# Patient Record
Sex: Female | Born: 1985 | Race: White | Hispanic: No | Marital: Married | Smoking: Never smoker
Health system: Southern US, Community
[De-identification: ages and names within clinical notes are randomized; demographics above are authoritative.]

## PROBLEM LIST (undated history)

## (undated) DIAGNOSIS — O99345 Other mental disorders complicating the puerperium: Secondary | ICD-10-CM

## (undated) DIAGNOSIS — D8989 Other specified disorders involving the immune mechanism, not elsewhere classified: Secondary | ICD-10-CM

## (undated) DIAGNOSIS — O24419 Gestational diabetes mellitus in pregnancy, unspecified control: Secondary | ICD-10-CM

## (undated) DIAGNOSIS — L409 Psoriasis, unspecified: Secondary | ICD-10-CM

## (undated) DIAGNOSIS — O139 Gestational [pregnancy-induced] hypertension without significant proteinuria, unspecified trimester: Secondary | ICD-10-CM

## (undated) DIAGNOSIS — L4050 Arthropathic psoriasis, unspecified: Secondary | ICD-10-CM

## (undated) DIAGNOSIS — F53 Postpartum depression: Secondary | ICD-10-CM

## (undated) HISTORY — DX: Other specified disorders involving the immune mechanism, not elsewhere classified: D89.89

## (undated) HISTORY — PX: NO PAST SURGERIES: SHX2092

## (undated) HISTORY — DX: Other mental disorders complicating the puerperium: O99.345

## (undated) HISTORY — DX: Postpartum depression: F53.0

## (undated) HISTORY — DX: Gestational diabetes mellitus in pregnancy, unspecified control: O24.419

## (undated) HISTORY — DX: Psoriasis, unspecified: L40.9

## (undated) HISTORY — DX: Arthropathic psoriasis, unspecified: L40.50

## (undated) HISTORY — DX: Gestational (pregnancy-induced) hypertension without significant proteinuria, unspecified trimester: O13.9

---

## 2005-01-01 ENCOUNTER — Emergency Department (HOSPITAL_COMMUNITY): Admission: EM | Admit: 2005-01-01 | Discharge: 2005-01-01 | Payer: Self-pay | Admitting: Emergency Medicine

## 2009-09-02 ENCOUNTER — Emergency Department (HOSPITAL_COMMUNITY): Admission: EM | Admit: 2009-09-02 | Discharge: 2009-09-02 | Payer: Self-pay | Admitting: Emergency Medicine

## 2010-02-14 ENCOUNTER — Inpatient Hospital Stay (HOSPITAL_COMMUNITY)
Admission: EM | Admit: 2010-02-14 | Discharge: 2010-02-16 | Payer: Self-pay | Attending: Family Medicine | Admitting: Family Medicine

## 2010-02-15 ENCOUNTER — Encounter: Payer: Self-pay | Admitting: Family Medicine

## 2010-02-19 ENCOUNTER — Encounter: Payer: Self-pay | Admitting: Infectious Diseases

## 2010-02-19 ENCOUNTER — Ambulatory Visit
Admission: RE | Admit: 2010-02-19 | Discharge: 2010-02-19 | Payer: Self-pay | Source: Home / Self Care | Attending: Infectious Diseases | Admitting: Infectious Diseases

## 2010-02-19 DIAGNOSIS — R509 Fever, unspecified: Secondary | ICD-10-CM | POA: Insufficient documentation

## 2010-02-19 LAB — CONVERTED CEMR LAB
ANA Titer 1: 1:320 {titer} — ABNORMAL HIGH
Anti Nuclear Antibody(ANA): POSITIVE — AB
CMV IgM: 0.18 (ref <0.90)
Cytomegalovirus Ab-IgG: 0.25 (ref <0.90)
ENA SM Ab Ser-aCnc: <1 (ref <30)
HIV 1 RNA Quant: <20 copies/mL (ref <20)
HIV-1 RNA Quant, Log: <1.3 (ref <1.30)
Scleroderma (Scl-70) (ENA) Antibody, IgG: <1 (ref <30)
TSH: 3.663 microintl units/mL (ref 0.350–4.500)
Toxoplasma Antibody- IgM: <0.1
Toxoplasma IgG Ratio: <0.5
ds DNA Ab: 2 (ref <30)

## 2010-02-20 ENCOUNTER — Encounter: Payer: Self-pay | Admitting: Infectious Diseases

## 2010-02-23 ENCOUNTER — Telehealth: Payer: Self-pay | Admitting: Infectious Diseases

## 2010-03-01 ENCOUNTER — Ambulatory Visit
Admission: RE | Admit: 2010-03-01 | Discharge: 2010-03-01 | Payer: Self-pay | Source: Home / Self Care | Attending: Infectious Diseases | Admitting: Infectious Diseases

## 2010-03-01 ENCOUNTER — Encounter: Payer: Self-pay | Admitting: Infectious Diseases

## 2010-03-01 LAB — CONVERTED CEMR LAB
EBV NA IgG: 0.05
EBV VCA IgG: 0.09
EBV VCA IgM: 0.21

## 2010-03-08 ENCOUNTER — Encounter: Payer: Self-pay | Admitting: Infectious Diseases

## 2010-03-10 ENCOUNTER — Encounter: Payer: Self-pay | Admitting: Infectious Diseases

## 2010-03-18 NOTE — Miscellaneous (Signed)
Summary: HIPAA Restrictions  HIPAA Restrictions   Imported By: Florinda Marker 02/24/2010 15:05:24  _____________________________________________________________________  External Attachment:    Type:   Image     Comment:   External Document

## 2010-03-18 NOTE — Progress Notes (Signed)
Summary: Daily fevers continue  Phone Note Call from Patient Call back at Home Phone [phone]   Caller: Patient Reason for Call: Talk to Nurse Summary of Call: Pt. reporting daily fevers continuing, 100-103.  Has been taking ibuprofen without relief.  RN shared that ANA titre has decreased from hospital titre.  Pt. has return appt. for 03/01/10.  Pt. agreed that she would wait to discuss rheumatology referral at that visit. Jennet Maduro RN  February 23, 2010 4:17 PM

## 2010-03-18 NOTE — Assessment & Plan Note (Signed)
Summary: new pt fevers 103x10 days/per jh   CC:  new patient with fevers x 10 days.  History of Present Illness: 25 yo F without PMHx until developing fevers in December, as high as 104. She also had night sweats and chills. She developed arthritis during this period as well. She was seen in urgent care and was given a course of doxy (? RMSF, serologies negative). Her ANA was 1:1280, RA negative. She was hospitalized Feb 14, 2010 and was found to have a negative CT of the abd/pelvis (low density cervix?), TTE negative. CRP 14.8 and mild anemia. She had BCx Jan 1,2,3 all negative.   Since d/c from hospital has continued to have fever- up to 103.8 last night. Has had chills and rigors as well. Last week had CXR and ankle film, both negative.  ROS- Mild cough, dry. No sore throat or runny nose. Weight has been down 7-8 #, apetite is decreased but now improving. No lymphadenopathy. Has occas joint pain, transient, mostly in R knee and R ankle. Nl BM. Nl urination. Nl periods. No headache.   Preventive Screening-Counseling & Management  Alcohol-Tobacco     Alcohol drinks/day: socially     Smoking Status: never      Drug Use:  no.     Updated Prior Medication List: HYDROCODONE-ACETAMINOPHEN 5-500 MG TABS (HYDROCODONE-ACETAMINOPHEN) 1 tabket once daily ADVIL 200 MG TABS (IBUPROFEN) as needed joint pain HYDROCODONE-ACETAMINOPHEN 5-325 MG TABS (HYDROCODONE-ACETAMINOPHEN) as needed pain, fever JUNEL 1/20 1-20 MG-MCG TABS (NORETHINDRONE ACET-ETHINYL EST) as directed  Current Allergies (reviewed today): ! PCN Past History:  Past Medical History: Current Problems:  FEVER UNSPECIFIED (ICD-780.60)  Family History: mother with psoriasis, cousin with breast CA in her 30s.  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  Never Smoked Alcohol use-yes- occas.  Drug use-no Drug Use:  no  Review of Systems       has inside cat and dog. no recent travel. no  oral ulcers, no rashes. takes OCP for regularity.    Vital Signs:  Patient profile:   25 year old female Height:      66 inches (167.64 cm) Weight:      145.25 pounds (66.02 kg) BMI:     23.53 Temp:     101.3 degrees F (38.50 degrees C) oral Pulse rate:   134 / minute BP sitting:   129 / 84  (right arm)  Vitals Entered By: Starleen Arms CMA (February 19, 2010 10:45 AM)  Current Medications (verified): 1)  Hydrocodone-Acetaminophen 5-500 Mg Tabs (Hydrocodone-Acetaminophen) .Marland Kitchen.. 1 Tabket Once Daily 2)  Advil 200 Mg Tabs (Ibuprofen) .... As Needed Joint Pain 3)  Hydrocodone-Acetaminophen 5-325 Mg Tabs (Hydrocodone-Acetaminophen) .... As Needed Pain, Fever 4)  Junel 1/20 1-20 Mg-Mcg Tabs (Norethindrone Acet-Ethinyl Est) .... As Directed  Allergies (verified): 1)  ! Pcn  CC: new patient with fevers x 10 days Is Patient Diabetic? No Pain Assessment Patient in pain? no      Nutritional Status BMI of 19 -24 = normal Nutritional Status Detail gotten back recently  Does patient need assistance? Functional Status Self care Ambulation Normal   Physical Exam  General:  well-developed, well-nourished, and well-hydrated.   Eyes:  pupils equal, pupils round, and pupils reactive to light.   Mouth:  pharynx pink and moist and no exudates.   Neck:  no masses.   Lungs:  normal respiratory effort and normal breath sounds.   Heart:  normal rate, regular rhythm, and no murmur.  Abdomen:  soft, non-tender, normal bowel sounds, and no masses.   Extremities:  no edema Neurologic:  alert & oriented X3, cranial nerves II-XII intact, strength normal in all extremities, and sensation intact to light touch.   Skin:  no rashes and no edema.   Cervical Nodes:  no anterior cervical adenopathy.   Axillary Nodes:  no R axillary adenopathy and no L axillary adenopathy.     Impression & Recommendations:  Problem # 1:  FEVER UNSPECIFIED (ICD-780.60)  In her age group, most comonly fevers would  be due to infections or rheumatologic conditions. Will check her for infections that would be common- Mono like syndromes especially. If these are negative, would consider rheum eval of this patient, especailly given her very high ANA (although negative DS DNA, could consider further testing). return to clinic 1- 2weeks.   Orders: Consultation Level IV (16109) T-CMV IgM  Antibody ([address]) T-CMV IgG Antibody ([id number]) T-HIV Viral Load [phone]) T-ENA Panel ([id number]) T-Syphilis Test (RPR) (69629-52841) T-TSH (32440-10272) T-Toxoplasma Antibody IgG (53664-40347) T-Toxoplasma Antibody IgM (42595-63875) T-Culture, Blood Routine ([id number])  Medications Added to Medication List This Visit: 1)  Hydrocodone-acetaminophen 5-500 Mg Tabs (Hydrocodone-acetaminophen) .Marland Kitchen.. 1 tabket once daily 2)  Advil 200 Mg Tabs (Ibuprofen) .... As needed joint pain 3)  Hydrocodone-acetaminophen 5-325 Mg Tabs (Hydrocodone-acetaminophen) .... As needed pain, fever 4)  Junel 1/20 1-20 Mg-mcg Tabs (Norethindrone acet-ethinyl est) .... As directed

## 2010-03-18 NOTE — Assessment & Plan Note (Signed)
Summary: 1WK F/U/VS   CC:  joint aches and fatigue.  History of Present Illness: 25 yo F without PMHx until developing fevers in December, as high as 104. She also had night sweats and chills. She developed arthritis during this period as well. She was seen in urgent care and was given a course of doxy (? RMSF, serologies negative). Her ANA was 1:1280, RA negative. She was hospitalized Feb 14, 2010 and was found to have a negative CT of the abd/pelvis (low density cervix?), TTE negative. CRP 14.8 and mild anemia. She had BCx Jan 1,2,3 all negative.   Since d/c from hospital has continued to have fever- up to 103.8 last night. Has had chills and rigors as well. Last week had CXR and ankle film, both negative. Seen in ID clinic recently and had multiple blood tests done- allnegative except for ANA 1:320. Has not had fever for last 3 days. Had 101 4 days ago. Feels tired today and has pain in her calves. No rashes, no joint swelling. states "that my hair always falls out bad". no bald spots. 1 oral ulcer, since resolved. has 38 yo cat, 1dog. denies bites or scratches  Preventive Screening-Counseling & Management  Alcohol-Tobacco     Alcohol drinks/day: 0     Smoking Status: never   Updated Prior Medication List: ADVIL 200 MG TABS (IBUPROFEN) as needed joint pain JUNEL 1/20 1-20 MG-MCG TABS (NORETHINDRONE ACET-ETHINYL EST) as directed  Current Allergies: ! PCN Past History:  Past medical, surgical, family and social histories (including risk factors) reviewed, and no changes noted (except as noted below).  Past Medical History: Reviewed history from 02/19/2010 and no changes required. Current Problems:  FEVER UNSPECIFIED (ICD-780.60)  Family History: Reviewed history from 02/19/2010 and no changes required. mother with psoriasis, cousin with breast CA in her 30s.  Social History: Reviewed history from 02/19/2010 and no changes required. 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  Never Smoked Alcohol use-yes- occas.  Drug use-no  Additional History Menstrual Status:  regular  Vital Signs:  Patient profile:   25 year old female Menstrual status:  regular LMP:     01/25/2010 Height:      66 inches (167.64 cm) Weight:      145 pounds (65.91 kg) BMI:     23.49 Temp:     98.2 degrees F oral Pulse rate:   132 / minute BP sitting:   153 / 86  (right arm)  Vitals Entered By: Starleen Arms CMA (March 01, 2010 3:21 PM) CC: joint aches and fatigue Is Patient Diabetic? No Pain Assessment Patient in pain? no      Nutritional Status BMI of 19 -24 = normal  Does patient need assistance? Functional Status Self care Ambulation Normal LMP (date): 01/25/2010     Menstrual Status regular Enter LMP: 01/25/2010   Physical Exam  General:  well-developed, well-nourished, and well-hydrated.   Eyes:  pupils equal, pupils round, and pupils reactive to light.   Mouth:  pharynx pink and moist, no erythema, and no exudates.   Neck:  no masses.   Lungs:  normal respiratory effort and normal breath sounds.   Heart:  normal rate, regular rhythm, and no murmur.   Abdomen:  soft, non-tender, and normal bowel sounds.   Axillary Nodes:  no R axillary adenopathy and no L axillary adenopathy.     Impression & Recommendations:  Problem # 1:  FEVER UNSPECIFIED (ICD-780.60) Her infectious disease w/u has been negative. Will check  a EBV panel on her as well as cat scratch serologies.  Will have her eval by Rheumatology. will return to clinic as needed   Other Orders: Rheumatology Referral (Rheumatology) T-Epstein Barr Virus Antibody Panel I [phone]) T- * Misc. Laboratory test [phone]) Est. Patient Level III [phone])

## 2010-03-18 NOTE — Consult Note (Signed)
Summary: [facility]   Imported By: Florinda Marker 03/08/2010 13:33:17  _____________________________________________________________________  External Attachment:    Type:   Image     Comment:   External Document

## 2010-03-29 ENCOUNTER — Encounter: Payer: Self-pay | Admitting: Infectious Diseases

## 2010-04-13 NOTE — Consult Note (Signed)
[facility]   Imported By: Florinda Marker 04/09/2010 15:48:57  _____________________________________________________________________  External Attachment:    Type:   Image     Comment:   External Document

## 2010-04-22 NOTE — Consult Note (Signed)
Summary: [facility]   Imported By: Florinda Marker 04/13/2010 15:43:12  _____________________________________________________________________  External Attachment:    Type:   Image     Comment:   External Document

## 2010-04-26 LAB — CBC
HCT: 31.9 % — ABNORMAL LOW (ref 36.0–46.0)
HCT: 32.9 % — ABNORMAL LOW (ref 36.0–46.0)
Hemoglobin: 10.8 g/dL — ABNORMAL LOW (ref 12.0–15.0)
Hemoglobin: 11.1 g/dL — ABNORMAL LOW (ref 12.0–15.0)
MCH: 27.7 pg (ref 26.0–34.0)
MCH: 27.8 pg (ref 26.0–34.0)
MCHC: 33.7 g/dL (ref 30.0–36.0)
MCHC: 33.9 g/dL (ref 30.0–36.0)
MCV: 81.8 fL (ref 78.0–100.0)
MCV: 82.5 fL (ref 78.0–100.0)
Platelets: 277 10*3/uL (ref 150–400)
Platelets: 294 10*3/uL (ref 150–400)
RBC: 3.9 MIL/uL (ref 3.87–5.11)
RBC: 3.99 MIL/uL (ref 3.87–5.11)
RDW: 12.6 % (ref 11.5–15.5)
RDW: 13 % (ref 11.5–15.5)
WBC: 6.1 10*3/uL (ref 4.0–10.5)
WBC: 7.1 10*3/uL (ref 4.0–10.5)

## 2010-04-26 LAB — DIFFERENTIAL
Basophils Absolute: 0 10*3/uL (ref 0.0–0.1)
Basophils Relative: 0 % (ref 0–1)
Eosinophils Absolute: 0 10*3/uL (ref 0.0–0.7)
Eosinophils Relative: 0 % (ref 0–5)
Lymphocytes Relative: 30 % (ref 12–46)
Lymphs Abs: 2.1 10*3/uL (ref 0.7–4.0)
Monocytes Absolute: 0.4 10*3/uL (ref 0.1–1.0)
Monocytes Relative: 6 % (ref 3–12)
Neutro Abs: 4.5 10*3/uL (ref 1.7–7.7)
Neutrophils Relative %: 64 % (ref 43–77)

## 2010-04-26 LAB — CULTURE, BLOOD (ROUTINE X 2)
Culture  Setup Time: 201201020124
Culture  Setup Time: 201201020124
Culture  Setup Time: 201201032147
Culture  Setup Time: 201201032147
Culture: NO GROWTH
Culture: NO GROWTH
Culture: NO GROWTH
Culture: NO GROWTH

## 2010-04-26 LAB — C-REACTIVE PROTEIN: CRP: 14.8 mg/dL — ABNORMAL HIGH (ref <0.6)

## 2010-04-26 LAB — CULTURE, BLOOD (SINGLE)
Culture  Setup Time: 201201021122
Culture  Setup Time: 201201021648
Culture: NO GROWTH
Culture: NO GROWTH

## 2010-04-26 LAB — ANTI-DNA ANTIBODY, DOUBLE-STRANDED: ds DNA Ab: 2 IU/mL (ref <30)

## 2010-04-27 ENCOUNTER — Encounter: Payer: Self-pay | Admitting: *Deleted

## 2010-05-01 LAB — COMPREHENSIVE METABOLIC PANEL
ALT: 16 U/L (ref 0–35)
AST: 21 U/L (ref 0–37)
Albumin: 3.8 g/dL (ref 3.5–5.2)
Alkaline Phosphatase: 46 U/L (ref 39–117)
BUN: 4 mg/dL — ABNORMAL LOW (ref 6–23)
CO2: 23 mEq/L (ref 19–32)
Calcium: 9.1 mg/dL (ref 8.4–10.5)
Chloride: 103 mEq/L (ref 96–112)
Creatinine, Ser: 0.75 mg/dL (ref 0.4–1.2)
GFR calc Af Amer: >60 mL/min (ref >60)
GFR calc non Af Amer: >60 mL/min (ref >60)
Glucose, Bld: 104 mg/dL — ABNORMAL HIGH (ref 70–99)
Potassium: 3.5 mEq/L (ref 3.5–5.1)
Sodium: 135 mEq/L (ref 135–145)
Total Bilirubin: 0.4 mg/dL (ref 0.3–1.2)
Total Protein: 7.4 g/dL (ref 6.0–8.3)

## 2010-05-01 LAB — PROTIME-INR
INR: 1.11 (ref 0.00–1.49)
Prothrombin Time: 14.2 seconds (ref 11.6–15.2)

## 2010-05-01 LAB — CBC
HCT: 37 % (ref 36.0–46.0)
Hemoglobin: 12.8 g/dL (ref 12.0–15.0)
MCH: 30 pg (ref 26.0–34.0)
MCHC: 34.7 g/dL (ref 30.0–36.0)
MCV: 86.4 fL (ref 78.0–100.0)
Platelets: 248 10*3/uL (ref 150–400)
RBC: 4.27 MIL/uL (ref 3.87–5.11)
RDW: 12.5 % (ref 11.5–15.5)
WBC: 11.9 10*3/uL — ABNORMAL HIGH (ref 4.0–10.5)

## 2010-05-01 LAB — LACTIC ACID, PLASMA: Lactic Acid, Venous: 1.8 mmol/L (ref 0.5–2.2)

## 2010-05-01 LAB — SAMPLE TO BLOOD BANK

## 2011-01-23 ENCOUNTER — Ambulatory Visit (INDEPENDENT_AMBULATORY_CARE_PROVIDER_SITE_OTHER): Payer: 59

## 2011-01-23 DIAGNOSIS — R05 Cough: Secondary | ICD-10-CM

## 2011-01-23 DIAGNOSIS — R059 Cough, unspecified: Secondary | ICD-10-CM

## 2011-07-03 ENCOUNTER — Ambulatory Visit (INDEPENDENT_AMBULATORY_CARE_PROVIDER_SITE_OTHER): Payer: 59 | Admitting: Family Medicine

## 2011-07-03 VITALS — BP 131/78 | HR 82 | Temp 98.0°F | Resp 16 | Ht 64.0 in | Wt 156.0 lb

## 2011-07-03 DIAGNOSIS — IMO0001 Reserved for inherently not codable concepts without codable children: Secondary | ICD-10-CM

## 2011-07-03 DIAGNOSIS — Z309 Encounter for contraceptive management, unspecified: Secondary | ICD-10-CM

## 2011-07-03 MED ORDER — NORETHIN ACE-ETH ESTRAD-FE 1-20 MG-MCG PO TABS: 1.0000 | ORAL_TABLET | Freq: Every day | ORAL | Status: DC

## 2011-07-03 NOTE — Progress Notes (Signed)
  Patient Name: Anne Phelps Date of Birth: [birth year] Medical Record Number: [id number] Gender: female Date of Encounter: 07/03/2011  History of Present Illness:  Anne Phelps is a 26 y.o. very pleasant female patient who presents with the following:  Here to have a pap smear and RF of her OCP.  She is happy with her current OCP.  Episode of FUO last year- up to 103. Extensive w/u did not reveal a source and this finally went away.  She is currently feeling well and has no other concerns.  She does not desire any BW or screening for other STI - will do GC/ CMZ with her pap only  Patient Active Problem List  Diagnoses  . FEVER UNSPECIFIED   No past medical history on file. No past surgical history on file. History  Substance Use Topics  . Smoking status: Never Smoker   . Smokeless tobacco: Not on file  . Alcohol Use: Not on file   No family history on file. Allergies  Allergen Reactions  . Penicillins     Medication list has been reviewed and updated.  Review of Systems: As per HPI- otherwise negative.   Physical Examination: Filed Vitals:   07/03/11 0946  BP: 131/78  Pulse: 82  Temp: 98 F (36.7 C)  TempSrc: Oral  Resp: 16  Height: 5\' 4"  (1.626 m)  Weight: 156 lb (70.761 kg)    Body mass index is 26.78 kg/(m^2).  GEN: WDWN, NAD, Non-toxic, A & O x 3 HEENT: Atraumatic, Normocephalic. Neck supple. No masses, No LAD.  Tm, oropharynx wnl Ears and Nose: No external deformity. CV: RRR, No M/G/R. No JVD. No thrill. No extra heart sounds. PULM: CTA B, no wheezes, crackles, rhonchi. No retractions. No resp. distress. No accessory muscle use. ABD: S, NT, ND, +BS. No rebound. No HSM. EXTR: No c/c/e NEURO Normal gait.  PSYCH: Normally interactive. Conversant. Not depressed or anxious appearing.  Calm demeanor.  GU: normal external and internal exam.  No CMT, adnexa wnl   Assessment and Plan: 1. Contraception  norethindrone-ethinyl estradiol (JUNEL FE,GILDESS  FE,LOESTRIN FE) 1-20 MG-MCG tablet, Pap IG, CT/NG w/ reflex HPV when ASC-U   Await pap results and will follow- up with her.  Refilled her OCP as above, continue to use as directed, she is not a smoker

## 2011-07-06 ENCOUNTER — Other Ambulatory Visit: Payer: Self-pay | Admitting: Physician Assistant

## 2011-07-06 LAB — PAP IG, CT-NG, RFX HPV ASCU

## 2011-07-07 ENCOUNTER — Encounter: Payer: Self-pay | Admitting: Family Medicine

## 2012-03-13 ENCOUNTER — Ambulatory Visit (INDEPENDENT_AMBULATORY_CARE_PROVIDER_SITE_OTHER): Payer: 59 | Admitting: Physician Assistant

## 2012-03-13 VITALS — BP 146/89 | HR 96 | Temp 99.0°F | Resp 16 | Ht 65.0 in | Wt 159.8 lb

## 2012-03-13 DIAGNOSIS — J019 Acute sinusitis, unspecified: Secondary | ICD-10-CM

## 2012-03-13 DIAGNOSIS — R059 Cough, unspecified: Secondary | ICD-10-CM

## 2012-03-13 DIAGNOSIS — R05 Cough: Secondary | ICD-10-CM

## 2012-03-13 MED ORDER — IPRATROPIUM BROMIDE 0.06 % NA SOLN: 2.0000 | Freq: Three times a day (TID) | NASAL | Status: DC

## 2012-03-13 MED ORDER — AZITHROMYCIN 250 MG PO TABS: ORAL_TABLET | ORAL | Status: DC

## 2012-03-13 MED ORDER — HYDROCODONE-HOMATROPINE 5-1.5 MG/5ML PO SYRP
ORAL_SOLUTION | ORAL | Status: DC
Start: 2012-03-13 — End: 2012-05-26

## 2012-03-13 NOTE — Progress Notes (Signed)
Patient ID: Anne Phelps MRN: [id number], DOB: 04/28/1985, 27 y.o. Date of Encounter: 03/13/2012, 11:14 AM  Primary Physician: No primary provider on file.  Chief Complaint:  Chief Complaint  Patient presents with  . Sore Throat    Sunday  . Nasal Congestion  . Sinusitis  . Cough    non productive    HPI: 27 y.o. year old female presents with a two to three day history of nasal congestion, post nasal drip, sore throat, sinus pressure, and cough. Afebrile. No chills. No myalgias. Nasal congestion thick and green/yellow. Sinus pressure is the worst symptom. Cough is not productive and secondary to post nasal drip. It is not associated with time of day. Ears feel full, leading to sensation of muffled hearing. Has tried OTC cold preps without success. No GI complaints. Appetite normal. She works in a pre school with multiple sick contacts. No recent antibiotics or recent travels. No leg trauma, sedentary periods, h/o cancer, or tobacco use.  Past Medical History  Diagnosis Date  . Autoimmune disorder      Home Meds: Prior to Admission medications   Medication Sig Start Date End Date Taking? Authorizing Provider  ibuprofen (ADVIL,MOTRIN) 200 MG tablet as needed.     Yes Historical Provider, MD  norethindrone-ethinyl estradiol (JUNEL FE,GILDESS FE,LOESTRIN FE) 1-20 MG-MCG tablet Take 1 tablet by mouth daily. 07/03/11  Yes Pearline Cables, MD                         Allergies:  Allergies  Allergen Reactions  . Penicillins     History   Social History  . Marital Status: Single    Spouse Name: N/A    Number of Children: N/A  . Years of Education: N/A   Occupational History  . Not on file.   Social History Main Topics  . Smoking status: Never Smoker   . Smokeless tobacco: Not on file  . Alcohol Use: Not on file  . Drug Use: Not on file  . Sexually Active: Not on file   Other Topics Concern  . Not on file   Social History Narrative  . No narrative on file      Review of Systems: Constitutional: Positive for fatigue. Negative for chills, fever, night sweats or weight changes HEENT: see above Cardiovascular: negative for chest pain or palpitations Respiratory: Positive for cough and wheezing. Negative for hemoptysis, dyspnea, or shortness of breath Abdominal: negative for abdominal pain, nausea, vomiting or diarrhea Dermatological: negative for rash Neurologic: Positive for headache. Negative for dizziness or vertigo   Physical Exam: Blood pressure 146/89, pulse 96, temperature 99 F (37.2 C), temperature source Oral, resp. rate 16, height 5\' 5"  (1.651 m), weight 159 lb 12.8 oz (72.485 kg), SpO2 100.00%., Body mass index is 26.59 kg/(m^2). General: Well developed, well nourished, in no acute distress. Head: Normocephalic, atraumatic, eyes without discharge, sclera non-icteric, nares are congested. Bilateral auditory canals clear, TM's are without perforation, pearly grey with reflective cone of light bilaterally. Serous effusion bilaterally behind TM's. Bilaterally maxillary sinus TTP. Oral cavity moist, dentition normal. Posterior pharynx with post nasal drip and mild erythema. No peritonsillar abscess or tonsillar exudate. Uvula midline. Neck: Supple. No thyromegaly. Full ROM. No lymphadenopathy. Lungs: Clear bilaterally to auscultation without wheezes, rales, or rhonchi. Breathing is unlabored.  Heart: RRR with S1 S2. No murmurs, rubs, or gallops appreciated. Msk:  Strength and tone normal for age. Extremities: No clubbing or cyanosis. No  edema. Neuro: Alert and oriented X 3. Moves all extremities spontaneously. CNII-XII grossly in tact. Psych:  Responds to questions appropriately with a normal affect.     ASSESSMENT AND PLAN:  27 y.o. year old female with sinusitis -Azithromycin 500 mg 1 po daily #5 no RF -Atrovent NS 0.06% 2 sprays each nare bid prn #1 no RF -Hycodan #4oz 1 tsp po q 4-6 hours prn cough no RF  SED -Mucinex -Tylenol/Motrin prn -Rest/fluids -RTC precautions -RTC 3-5 days if no improvement  Signed, Eula Listen, PA-C 03/13/2012 11:14 AM

## 2012-05-26 ENCOUNTER — Ambulatory Visit (INDEPENDENT_AMBULATORY_CARE_PROVIDER_SITE_OTHER): Payer: 59 | Admitting: Physician Assistant

## 2012-05-26 VITALS — BP 130/80 | HR 72 | Temp 98.8°F | Resp 18 | Ht 64.0 in | Wt 161.4 lb

## 2012-05-26 DIAGNOSIS — IMO0001 Reserved for inherently not codable concepts without codable children: Secondary | ICD-10-CM

## 2012-05-26 DIAGNOSIS — Z309 Encounter for contraceptive management, unspecified: Secondary | ICD-10-CM

## 2012-05-26 DIAGNOSIS — N6459 Other signs and symptoms in breast: Secondary | ICD-10-CM

## 2012-05-26 MED ORDER — NORETHIN ACE-ETH ESTRAD-FE 1-20 MG-MCG PO TABS: 1.0000 | ORAL_TABLET | Freq: Every day | ORAL | Status: DC

## 2012-05-26 NOTE — Progress Notes (Signed)
  Subjective:    Patient ID: Anne Phelps, female    DOB: 11-05-1985, 27 y.o.   MRN: [id number]  HPI  27 yr old CF presents for [place] [place] planning.  She has been on [facility] pills for a while.  She and her fiancee want to stop using birth control around August of this year.  She isnt taking prenatal vitamins.  She has never been a smoker and her BP has always been normal.  She had a pap smear last year and it was normal.  No h/o hpv. Monogamous with fiancee.  Review of Systems  All other systems reviewed and are negative.       Objective:   Physical Exam  Nursing note and vitals reviewed. Constitutional: She is oriented to person, place, and time. She appears well-developed and well-nourished.  HENT:  Head: Normocephalic and atraumatic.  Cardiovascular: Normal rate, regular rhythm and normal heart sounds.   Pulmonary/Chest: Effort normal and breath sounds normal. Right breast exhibits no inverted nipple, no mass, no nipple discharge, no skin change and no tenderness. Left breast exhibits no inverted nipple, no mass, no nipple discharge, no skin change and no tenderness. Breasts are symmetrical.  Neurological: She is alert and oriented to person, place, and time.  Skin: Skin is warm and dry.  Psychiatric: She has a normal mood and affect. Her behavior is normal.      Assessment & Plan:  BC planning-start PNV now, well before going off OCP.  Once she stops OCPs, she needs to wait thru 3 regular periods before she stops using condoms. Pap smears every 3 years. None indicated today.

## 2012-05-31 ENCOUNTER — Other Ambulatory Visit: Payer: Self-pay | Admitting: Family Medicine

## 2013-02-14 NOTE — L&D Delivery Note (Signed)
Delivery Note At 8:57 AM a viable and healthy female was delivered via Vaginal, Spontaneous Delivery (Presentation: ROA ).  APGAR: 9, 9; weight  pending Placenta status: Intact, Spontaneous.  Cord: 3 vessels with the following complications: None.   Anesthesia: Epidural  Episiotomy: None Lacerations:1st degree Suture Repair: 3.0 monocryl Est. Blood Loss (mL): 200  Mom to postpartum.  Baby to Couplet care / Skin to Skin.  Lind Covert SNM 11/03/2013, 9:20 AM

## 2013-02-14 NOTE — L&D Delivery Note (Signed)
Agree with above assessment

## 2013-03-18 ENCOUNTER — Ambulatory Visit (INDEPENDENT_AMBULATORY_CARE_PROVIDER_SITE_OTHER): Payer: Medicaid Other | Admitting: *Deleted

## 2013-03-18 ENCOUNTER — Encounter: Payer: Self-pay | Admitting: *Deleted

## 2013-03-18 VITALS — BP 134/81 | Wt 167.0 lb

## 2013-03-18 DIAGNOSIS — Z3480 Encounter for supervision of other normal pregnancy, unspecified trimester: Secondary | ICD-10-CM

## 2013-03-18 NOTE — Patient Instructions (Signed)

## 2013-03-18 NOTE — Progress Notes (Signed)
P-85 New OB intake visit.  Patient is doing well.  Bloodwork and urine culture collected today and patient has made follow up appointment for one week.  Discussed first trimester screening and she will discuss with her fiance and let us know next week at her visit if this is something that they want to pursue.

## 2013-03-19 ENCOUNTER — Encounter: Payer: Self-pay | Admitting: Obstetrics and Gynecology

## 2013-03-19 DIAGNOSIS — Z6791 Unspecified blood type, Rh negative: Secondary | ICD-10-CM | POA: Insufficient documentation

## 2013-03-19 DIAGNOSIS — O26899 Other specified pregnancy related conditions, unspecified trimester: Secondary | ICD-10-CM

## 2013-03-19 LAB — OBSTETRIC PANEL
ANTIBODY SCREEN: NEGATIVE
BASOS ABS: 0 10*3/uL (ref 0.0–0.1)
BASOS PCT: 0 % (ref 0–1)
EOS ABS: 0.1 10*3/uL (ref 0.0–0.7)
Eosinophils Relative: 1 % (ref 0–5)
HCT: 34 % — ABNORMAL LOW (ref 36.0–46.0)
Hemoglobin: 11.7 g/dL — ABNORMAL LOW (ref 12.0–15.0)
Hepatitis B Surface Ag: NEGATIVE
Lymphocytes Relative: 18 % (ref 12–46)
Lymphs Abs: 1.7 10*3/uL (ref 0.7–4.0)
MCH: 28.4 pg (ref 26.0–34.0)
MCHC: 34.4 g/dL (ref 30.0–36.0)
MCV: 82.5 fL (ref 78.0–100.0)
MONO ABS: 0.5 10*3/uL (ref 0.1–1.0)
MONOS PCT: 5 % (ref 3–12)
NEUTROS ABS: 7.4 10*3/uL (ref 1.7–7.7)
NEUTROS PCT: 76 % (ref 43–77)
Platelets: 298 10*3/uL (ref 150–400)
RBC: 4.12 MIL/uL (ref 3.87–5.11)
RDW: 13.1 % (ref 11.5–15.5)
RH TYPE: NEGATIVE
Rubella: 19.3 Index — ABNORMAL HIGH (ref <0.90)
WBC: 9.7 10*3/uL (ref 4.0–10.5)

## 2013-03-19 LAB — HIV ANTIBODY (ROUTINE TESTING W REFLEX): HIV: NONREACTIVE

## 2013-03-19 LAB — CULTURE, OB URINE
COLONY COUNT: NO GROWTH
ORGANISM ID, BACTERIA: NO GROWTH

## 2013-03-20 LAB — CYSTIC FIBROSIS DIAGNOSTIC STUDY

## 2013-03-27 ENCOUNTER — Ambulatory Visit (INDEPENDENT_AMBULATORY_CARE_PROVIDER_SITE_OTHER): Payer: No Typology Code available for payment source | Admitting: Obstetrics and Gynecology

## 2013-03-27 ENCOUNTER — Encounter: Payer: Self-pay | Admitting: Obstetrics and Gynecology

## 2013-03-27 VITALS — BP 142/85 | Wt 168.4 lb

## 2013-03-27 DIAGNOSIS — O26899 Other specified pregnancy related conditions, unspecified trimester: Principal | ICD-10-CM

## 2013-03-27 DIAGNOSIS — D8989 Other specified disorders involving the immune mechanism, not elsewhere classified: Secondary | ICD-10-CM

## 2013-03-27 DIAGNOSIS — O360990 Maternal care for other rhesus isoimmunization, unspecified trimester, not applicable or unspecified: Secondary | ICD-10-CM

## 2013-03-27 DIAGNOSIS — L409 Psoriasis, unspecified: Secondary | ICD-10-CM | POA: Insufficient documentation

## 2013-03-27 DIAGNOSIS — Z113 Encounter for screening for infections with a predominantly sexual mode of transmission: Secondary | ICD-10-CM

## 2013-03-27 DIAGNOSIS — M359 Systemic involvement of connective tissue, unspecified: Secondary | ICD-10-CM

## 2013-03-27 DIAGNOSIS — Z3400 Encounter for supervision of normal first pregnancy, unspecified trimester: Secondary | ICD-10-CM | POA: Insufficient documentation

## 2013-03-27 DIAGNOSIS — Z6791 Unspecified blood type, Rh negative: Secondary | ICD-10-CM

## 2013-03-27 DIAGNOSIS — Z124 Encounter for screening for malignant neoplasm of cervix: Secondary | ICD-10-CM

## 2013-03-27 NOTE — Patient Instructions (Signed)
Pregnancy - First Trimester During sexual intercourse, millions of sperm go into the vagina. Only 1 sperm will penetrate and fertilize the female egg while it is in the Fallopian tube. One week later, the fertilized egg implants into the wall of the uterus. An embryo begins to develop into a baby. At 6 to 8 weeks, the eyes and face are formed and the heartbeat can be seen on ultrasound. At the end of 12 weeks (first trimester), all the baby's organs are formed. Now that you are pregnant, you will want to do everything you can to have a healthy baby. Two of the most important things are to get good prenatal care and follow your caregiver's instructions. Prenatal care is all the medical care you receive before the baby's birth. It is given to prevent, find, and treat problems during the pregnancy and childbirth. PRENATAL EXAMS  During prenatal visits, your weight, blood pressure, and urine are checked. This is done to make sure you are healthy and progressing normally during the pregnancy.  A pregnant woman should gain 25 to 35 pounds during the pregnancy. However, if you are overweight or underweight, your caregiver will advise you regarding your weight.  Your caregiver will ask and answer questions for you.  Blood work, cervical cultures, other necessary tests, and a Pap test are done during your prenatal exams. These tests are done to check on your health and the probable health of your baby. Tests are strongly recommended and done for HIV with your permission. This is the virus that causes AIDS. These tests are done because medicines can be given to help prevent your baby from being born with this infection should you have been infected without knowing it. Blood work is also used to find out your blood type, previous infections, and follow your blood levels (hemoglobin).  Low hemoglobin (anemia) is common during pregnancy. Iron and vitamins are given to help prevent this. Later in the pregnancy,  blood tests for diabetes will be done along with any other tests if any problems develop.  You may need other tests to make sure you and the baby are doing well. CHANGES DURING THE FIRST TRIMESTER  Your body goes through many changes during pregnancy. They vary from person to person. Talk to your caregiver about changes you notice and are concerned about. Changes can include:  Your menstrual period stops.  The egg and sperm carry the genes that determine what you look like. Genes from you and your partner are forming a baby. The female genes determine whether the baby is a boy or a girl.  Your body increases in girth and you may feel bloated.  Feeling sick to your stomach (nauseous) and throwing up (vomiting). If the vomiting is uncontrollable, call your caregiver.  Your breasts will begin to enlarge and become tender.  Your nipples may stick out more and become darker.  The need to urinate more. Painful urination may mean you have a bladder infection.  Tiring easily.  Loss of appetite.  Cravings for certain kinds of food.  At first, you may gain or lose a couple of pounds.  You may have changes in your emotions from day to day (excited to be pregnant or concerned something may go wrong with the pregnancy and baby).  You may have more vivid and strange dreams. HOME CARE INSTRUCTIONS   It is very important to avoid all smoking, alcohol and non-prescribed drugs during your pregnancy. These affect the formation and growth of the baby.   Avoid chemicals while pregnant to ensure the delivery of a healthy infant.  Start your prenatal visits by the 12th week of pregnancy. They are usually scheduled monthly at first, then more often in the last 2 months before delivery. Keep your caregiver's appointments. Follow your caregiver's instructions regarding medicine use, blood and lab tests, exercise, and diet.  During pregnancy, you are providing food for you and your baby. Eat regular,  well-balanced meals. Choose foods such as meat, fish, milk and other low fat dairy products, vegetables, fruits, and whole-grain breads and cereals. Your caregiver will tell you of the ideal weight gain.  You can help morning sickness by keeping soda crackers at the bedside. Eat a couple before arising in the morning. You may want to use the crackers without salt on them.  Eating 4 to 5 small meals rather than 3 large meals a day also may help the nausea and vomiting.  Drinking liquids between meals instead of during meals also seems to help nausea and vomiting.  A physical sexual relationship may be continued throughout pregnancy if there are no other problems. Problems may be early (premature) leaking of amniotic fluid from the membranes, vaginal bleeding, or belly (abdominal) pain.  Exercise regularly if there are no restrictions. Check with your caregiver or physical therapist if you are unsure of the safety of some of your exercises. Greater weight gain will occur in the last 2 trimesters of pregnancy. Exercising will help:  Control your weight.  Keep you in shape.  Prepare you for labor and delivery.  Help you lose your pregnancy weight after you deliver your baby.  Wear a good support or jogging bra for breast tenderness during pregnancy. This may help if worn during sleep too.  Ask when prenatal classes are available. Begin classes when they are offered.  Do not use hot tubs, steam rooms, or saunas.  Wear your seat belt when driving. This protects you and your baby if you are in an accident.  Avoid raw meat, uncooked cheese, cat litter boxes, and soil used by cats throughout the pregnancy. These carry germs that can cause birth defects in the baby.  The first trimester is a good time to visit your dentist for your dental health. Getting your teeth cleaned is okay. Use a softer toothbrush and brush gently during pregnancy.  Ask for help if you have financial, counseling, or  nutritional needs during pregnancy. Your caregiver will be able to offer counseling for these needs as well as refer you for other special needs.  Do not take any medicines or herbs unless told by your caregiver.  Inform your caregiver if there is any mental or physical domestic violence.  Make a list of emergency phone numbers of family, friends, hospital, and police and fire departments.  Write down your questions. Take them to your prenatal visit.  Do not douche.  Do not cross your legs.  If you have to stand for long periods of time, rotate you feet or take small steps in a circle.  You may have more vaginal secretions that may require a sanitary pad. Do not use tampons or scented sanitary pads. MEDICINES AND DRUG USE IN PREGNANCY  Take prenatal vitamins as directed. The vitamin should contain 1 milligram of folic acid. Keep all vitamins out of reach of children. Only a couple vitamins or tablets containing iron may be fatal to a baby or young child when ingested.  Avoid use of all medicines, including herbs, over-the-counter medicines, not   prescribed or suggested by your caregiver. Only take over-the-counter or prescription medicines for pain, discomfort, or fever as directed by your caregiver. Do not use aspirin, ibuprofen, or naproxen unless directed by your caregiver.  Let your caregiver also know about herbs you may be using.  Alcohol is related to a number of birth defects. This includes fetal alcohol syndrome. All alcohol, in any form, should be avoided completely. Smoking will cause low birth rate and premature babies.  Street or illegal drugs are very harmful to the baby. They are absolutely forbidden. A baby born to an addicted mother will be addicted at birth. The baby will go through the same withdrawal an adult does.  Let your caregiver know about any medicines that you have to take and for what reason you take them. SEEK MEDICAL CARE IF:  You have any concerns or  worries during your pregnancy. It is better to call with your questions if you feel they cannot wait, rather than worry about them. SEEK IMMEDIATE MEDICAL CARE IF:   An unexplained oral temperature above 102 F (38.9 C) develops, or as your caregiver suggests.  You have leaking of fluid from the vagina (birth canal). If leaking membranes are suspected, take your temperature and inform your caregiver of this when you call.  There is vaginal spotting or bleeding. Notify your caregiver of the amount and how many pads are used.  You develop a bad smelling vaginal discharge with a change in the color.  You continue to feel sick to your stomach (nauseated) and have no relief from remedies suggested. You vomit blood or coffee ground-like materials.  You lose more than 2 pounds of weight in 1 week.  You gain more than 2 pounds of weight in 1 week and you notice swelling of your face, hands, feet, or legs.  You gain 5 pounds or more in 1 week (even if you do not have swelling of your hands, face, legs, or feet).  You get exposed to [place] measles and have never had them.  You are exposed to fifth disease or chickenpox.  You develop belly (abdominal) pain. Round ligament discomfort is a common non-cancerous (benign) cause of abdominal pain in pregnancy. Your caregiver still must evaluate this.  You develop headache, fever, diarrhea, pain with urination, or shortness of breath.  You fall or are in a car accident or have any kind of trauma.  There is mental or physical violence in your home. Document Released: 01/25/2001 Document Revised: 10/26/2011 Document Reviewed: 07/29/2008 [facility] Patient Information 2014 [place].  Contraception Choices Contraception (birth control) is the use of any methods or devices to prevent pregnancy. Below are some methods to help avoid pregnancy. HORMONAL METHODS   Contraceptive implant This is a thin, plastic tube containing progesterone hormone. It  does not contain estrogen hormone. Your health care provider inserts the tube in the inner part of the upper arm. The tube can remain in place for up to 3 years. After 3 years, the implant must be removed. The implant prevents the ovaries from releasing an egg (ovulation), thickens the cervical mucus to prevent sperm from entering the uterus, and thins the lining of the inside of the uterus.  Progesterone-only injections These injections are given every 3 months by your health care provider to prevent pregnancy. This synthetic progesterone hormone stops the ovaries from releasing eggs. It also thickens cervical mucus and changes the uterine lining. This makes it harder for sperm to survive in the uterus.  Birth  control pills These pills contain estrogen and progesterone hormone. They work by preventing the ovaries from releasing eggs (ovulation). They also cause the cervical mucus to thicken, preventing the sperm from entering the uterus. Birth control pills are prescribed by a health care provider.Birth control pills can also be used to treat heavy periods.  Minipill This type of birth control pill contains only the progesterone hormone. They are taken every day of each month and must be prescribed by your health care provider.  Birth control patch The patch contains hormones similar to those in birth control pills. It must be changed once a week and is prescribed by a health care provider.  Vaginal ring The ring contains hormones similar to those in birth control pills. It is left in the vagina for 3 weeks, removed for 1 week, and then a new one is put back in place. The patient must be comfortable inserting and removing the ring from the vagina.A health care provider's prescription is necessary.  Emergency contraception Emergency contraceptives prevent pregnancy after unprotected sexual intercourse. This pill can be taken right after sex or up to 5 days after unprotected sex. It is most effective  the sooner you take the pills after having sexual intercourse. Most emergency contraceptive pills are available without a prescription. Check with your pharmacist. Do not use emergency contraception as your only form of birth control. BARRIER METHODS   Female condom This is a thin sheath (latex or rubber) that is worn over the penis during sexual intercourse. It can be used with spermicide to increase effectiveness.  Female condom. This is a soft, loose-fitting sheath that is put into the vagina before sexual intercourse.  Diaphragm This is a soft, latex, dome-shaped barrier that must be fitted by a health care provider. It is inserted into the vagina, along with a spermicidal jelly. It is inserted before intercourse. The diaphragm should be left in the vagina for 6 to 8 hours after intercourse.  Cervical cap This is a round, soft, latex or plastic cup that fits over the cervix and must be fitted by a health care provider. The cap can be left in place for up to 48 hours after intercourse.  Sponge This is a soft, circular piece of polyurethane foam. The sponge has spermicide in it. It is inserted into the vagina after wetting it and before sexual intercourse.  Spermicides These are chemicals that kill or block sperm from entering the cervix and uterus. They come in the form of creams, jellies, suppositories, foam, or tablets. They do not require a prescription. They are inserted into the vagina with an applicator before having sexual intercourse. The process must be repeated every time you have sexual intercourse. INTRAUTERINE CONTRACEPTION  Intrauterine device (IUD) This is a T-shaped device that is put in a woman's uterus during a menstrual period to prevent pregnancy. There are 2 types:  Copper IUD This type of IUD is wrapped in copper wire and is placed inside the uterus. Copper makes the uterus and fallopian tubes produce a fluid that kills sperm. It can stay in place for 10 years.  Hormone IUD  This type of IUD contains the hormone progestin (synthetic progesterone). The hormone thickens the cervical mucus and prevents sperm from entering the uterus, and it also thins the uterine lining to prevent implantation of a fertilized egg. The hormone can weaken or kill the sperm that get into the uterus. It can stay in place for 3 5 years, depending on which type of  IUD is used. PERMANENT METHODS OF CONTRACEPTION  Female tubal ligation This is when the woman's fallopian tubes are surgically sealed, tied, or blocked to prevent the egg from traveling to the uterus.  Hysteroscopic sterilization This involves placing a small coil or insert into each fallopian tube. Your doctor uses a technique called hysteroscopy to do the procedure. The device causes scar tissue to form. This results in permanent blockage of the fallopian tubes, so the sperm cannot fertilize the egg. It takes about 3 months after the procedure for the tubes to become blocked. You must use another form of birth control for these 3 months.  Female sterilization This is when the female has the tubes that carry sperm tied off (vasectomy).This blocks sperm from entering the vagina during sexual intercourse. After the procedure, the man can still ejaculate fluid (semen). NATURAL PLANNING METHODS  Natural family planning This is not having sexual intercourse or using a barrier method (condom, diaphragm, cervical cap) on days the woman could become pregnant.  Calendar method This is keeping track of the length of each menstrual cycle and identifying when you are fertile.  Ovulation method This is avoiding sexual intercourse during ovulation.  Symptothermal method This is avoiding sexual intercourse during ovulation, using a thermometer and ovulation symptoms.  Post ovulation method This is timing sexual intercourse after you have ovulated. Regardless of which type or method of contraception you choose, it is important that you use condoms to  protect against the transmission of sexually transmitted infections (STIs). Talk with your health care provider about which form of contraception is most appropriate for you. Document Released: 01/31/2005 Document Revised: 10/03/2012 Document Reviewed: 07/26/2012 [facility] Patient Information 2014 [place].  Breastfeeding Deciding to breastfeed is one of the best choices you can make for you and your baby. A change in hormones during pregnancy causes your breast tissue to grow and increases the number and size of your milk ducts. These hormones also allow proteins, sugars, and fats from your blood supply to make breast milk in your milk-producing glands. Hormones prevent breast milk from being released before your baby is born as well as prompt milk flow after birth. Once breastfeeding has begun, thoughts of your baby, as well as his or her sucking or crying, can stimulate the release of milk from your milk-producing glands.  BENEFITS OF BREASTFEEDING For Your Baby  Your first milk (colostrum) helps your baby's digestive system function better.   There are antibodies in your milk that help your baby fight off infections.   Your baby has a lower incidence of asthma, allergies, and sudden infant death syndrome.   The nutrients in breast milk are better for your baby than infant formulas and are designed uniquely for your baby's needs.   Breast milk improves your baby's brain development.   Your baby is less likely to develop other conditions, such as childhood obesity, asthma, or type 2 diabetes mellitus.  For You   Breastfeeding helps to create a very special bond between you and your baby.   Breastfeeding is convenient. Breast milk is always available at the correct temperature and costs nothing.   Breastfeeding helps to burn calories and helps you lose the weight gained during pregnancy.   Breastfeeding makes your uterus contract to its prepregnancy size faster and slows  bleeding (lochia) after you give birth.   Breastfeeding helps to lower your risk of developing type 2 diabetes mellitus, osteoporosis, and breast or ovarian cancer later in life. SIGNS THAT YOUR  BABY IS HUNGRY Early Signs of Hunger  Increased alertness or activity.  Stretching.  Movement of the head from side to side.  Movement of the head and opening of the mouth when the corner of the mouth or cheek is stroked (rooting).  Increased sucking sounds, smacking lips, cooing, sighing, or squeaking.  Hand-to-mouth movements.  Increased sucking of fingers or hands. Late Signs of Hunger  Fussing.  Intermittent crying. Extreme Signs of Hunger Signs of extreme hunger will require calming and consoling before your baby will be able to breastfeed successfully. Do not wait for the following signs of extreme hunger to occur before you initiate breastfeeding:   Restlessness.  A loud, strong cry.   Screaming. BREASTFEEDING BASICS Breastfeeding Initiation  Find a comfortable place to sit or lie down, with your neck and back well supported.  Place a pillow or rolled up blanket under your baby to bring him or her to the level of your breast (if you are seated). Nursing pillows are specially designed to help support your arms and your baby while you breastfeed.  Make sure that your baby's abdomen is facing your abdomen.   Gently massage your breast. With your fingertips, massage from your chest wall toward your nipple in a circular motion. This encourages milk flow. You may need to continue this action during the feeding if your milk flows slowly.  Support your breast with 4 fingers underneath and your thumb above your nipple. Make sure your fingers are well away from your nipple and your baby's mouth.   Stroke your baby's lips gently with your finger or nipple.   When your baby's mouth is open wide enough, quickly bring your baby to your breast, placing your entire nipple and as  much of the colored area around your nipple (areola) as possible into your baby's mouth.   More areola should be visible above your baby's upper lip than below the lower lip.   Your baby's tongue should be between his or her lower gum and your breast.   Ensure that your baby's mouth is correctly positioned around your nipple (latched). Your baby's lips should create a seal on your breast and be turned out (everted).  It is common for your baby to suck about 2 3 minutes in order to start the flow of breast milk. Latching Teaching your baby how to latch on to your breast properly is very important. An improper latch can cause nipple pain and decreased milk supply for you and poor weight gain in your baby. Also, if your baby is not latched onto your nipple properly, he or she may swallow some air during feeding. This can make your baby fussy. Burping your baby when you switch breasts during the feeding can help to get rid of the air. However, teaching your baby to latch on properly is still the best way to prevent fussiness from swallowing air while breastfeeding. Signs that your baby has successfully latched on to your nipple:    Silent tugging or silent sucking, without causing you pain.   Swallowing heard between every 3 4 sucks.    Muscle movement above and in front of his or her ears while sucking.  Signs that your baby has not successfully latched on to nipple:   Sucking sounds or smacking sounds from your baby while breastfeeding.  Nipple pain. If you think your baby has not latched on correctly, slip your finger into the corner of your baby's mouth to break the suction and place  it between your baby's gums. Attempt breastfeeding initiation again. Signs of Successful Breastfeeding Signs from your baby:   A gradual decrease in the number of sucks or complete cessation of sucking.   Falling asleep.   Relaxation of his or her body.   Retention of a small amount of milk in  his or her mouth.   Letting go of your breast by himself or herself. Signs from you:  Breasts that have increased in firmness, weight, and size 1 3 hours after feeding.   Breasts that are softer immediately after breastfeeding.  Increased milk volume, as well as a change in milk consistency and color by the 5th day of breastfeeding.   Nipples that are not sore, cracked, or bleeding. Signs That Your Baby is Getting Enough Milk  Wetting at least 3 diapers in a 24-hour period. The urine should be clear and pale yellow by age 5 days.  At least 3 stools in a 24-hour period by age 5 days. The stool should be soft and yellow.  At least 3 stools in a 24-hour period by age 7 days. The stool should be seedy and yellow.  No loss of weight greater than 10% of birth weight during the first 3 days of age.  Average weight gain of 4 7 ounces (120 210 mL) per week after age 4 days.  Consistent daily weight gain by age 5 days, without weight loss after the age of 2 weeks. After a feeding, your baby may spit up a small amount. This is common. BREASTFEEDING FREQUENCY AND DURATION Frequent feeding will help you make more milk and can prevent sore nipples and breast engorgement. Breastfeed when you feel the need to reduce the fullness of your breasts or when your baby shows signs of hunger. This is called "breastfeeding on demand." Avoid introducing a pacifier to your baby while you are working to establish breastfeeding (the first 4 6 weeks after your baby is born). After this time you may choose to use a pacifier. Research has shown that pacifier use during the first year of a baby's life decreases the risk of sudden infant death syndrome (SIDS). Allow your baby to feed on each breast as long as he or she wants. Breastfeed until your baby is finished feeding. When your baby unlatches or falls asleep while feeding from the first breast, offer the second breast. Because newborns are often sleepy in the  first few weeks of life, you may need to awaken your baby to get him or her to feed. Breastfeeding times will vary from baby to baby. However, the following rules can serve as a guide to help you ensure that your baby is properly fed:  Newborns (babies 4 weeks of age or younger) may breastfeed every 1 3 hours.  Newborns should not go longer than 3 hours during the day or 5 hours during the night without breastfeeding.  You should breastfeed your baby a minimum of 8 times in a 24-hour period until you begin to introduce solid foods to your baby at around 6 months of age. BREAST MILK PUMPING Pumping and storing breast milk allows you to ensure that your baby is exclusively fed your breast milk, even at times when you are unable to breastfeed. This is especially important if you are going back to work while you are still breastfeeding or when you are not able to be present during feedings. Your lactation consultant can give you guidelines on how long it is safe to store breast   milk.  A breast pump is a machine that allows you to pump milk from your breast into a sterile bottle. The pumped breast milk can then be stored in a refrigerator or freezer. Some breast pumps are operated by hand, while others use electricity. Ask your lactation consultant which type will work best for you. Breast pumps can be purchased, but some hospitals and breastfeeding support groups lease breast pumps on a monthly basis. A lactation consultant can teach you how to hand express breast milk, if you prefer not to use a pump.  CARING FOR YOUR BREASTS WHILE YOU BREASTFEED Nipples can become dry, cracked, and sore while breastfeeding. The following recommendations can help keep your breasts moisturized and healthy:  Avoid using soap on your nipples.   Wear a supportive bra. Although not required, special nursing bras and tank tops are designed to allow access to your breasts for breastfeeding without taking off your entire bra  or top. Avoid wearing underwire style bras or extremely tight bras.  Air dry your nipples for 3 84minutes after each feeding.   Use only cotton bra pads to absorb leaked breast milk. Leaking of breast milk between feedings is normal.   Use lanolin on your nipples after breastfeeding. Lanolin helps to maintain your skin's normal moisture barrier. If you use pure lanolin you do not need to wash it off before feeding your baby again. Pure lanolin is not toxic to your baby. You may also hand express a few drops of breast milk and gently massage that milk into your nipples and allow the milk to air dry. In the first few weeks after giving birth, some women experience extremely full breasts (engorgement). Engorgement can make your breasts feel heavy, warm, and tender to the touch. Engorgement peaks within 3 5 days after you give birth. The following recommendations can help ease engorgement:  Completely empty your breasts while breastfeeding or pumping. You may want to start by applying warm, moist heat (in the shower or with warm water-soaked hand towels) just before feeding or pumping. This increases circulation and helps the milk flow. If your baby does not completely empty your breasts while breastfeeding, pump any extra milk after he or she is finished.  Wear a snug bra (nursing or regular) or tank top for 1 2 days to signal your body to slightly decrease milk production.  Apply ice packs to your breasts, unless this is too uncomfortable for you.  Make sure that your baby is latched on and positioned properly while breastfeeding. If engorgement persists after 48 hours of following these recommendations, contact your health care provider or a Science writer. OVERALL HEALTH CARE RECOMMENDATIONS WHILE BREASTFEEDING  Eat healthy foods. Alternate between meals and snacks, eating 3 of each per day. Because what you eat affects your breast milk, some of the foods may make your baby more irritable  than usual. Avoid eating these foods if you are sure that they are negatively affecting your baby.  Drink milk, fruit juice, and water to satisfy your thirst (about 10 glasses a day).   Rest often, relax, and continue to take your prenatal vitamins to prevent fatigue, stress, and anemia.  Continue breast self-awareness checks.  Avoid chewing and smoking tobacco.  Avoid alcohol and drug use. Some medicines that may be harmful to your baby can pass through breast milk. It is important to ask your health care provider before taking any medicine, including all over-the-counter and prescription medicine as well as vitamin and herbal supplements.  It is possible to become pregnant while breastfeeding. If birth control is desired, ask your health care provider about options that will be safe for your baby. SEEK MEDICAL CARE IF:   You feel like you want to stop breastfeeding or have become frustrated with breastfeeding.  You have painful breasts or nipples.  Your nipples are cracked or bleeding.  Your breasts are red, tender, or warm.  You have a swollen area on either breast.  You have a fever or chills.  You have nausea or vomiting.  You have drainage other than breast milk from your nipples.  Your breasts do not become full before feedings by the 5th day after you give birth.  You feel sad and depressed.  Your baby is too sleepy to eat well.  Your baby is having trouble sleeping.   Your baby is wetting less than 3 diapers in a 24-hour period.  Your baby has less than 3 stools in a 24-hour period.  Your baby's skin or the white part of his or her eyes becomes yellow.   Your baby is not gaining weight by 6 days of age. SEEK IMMEDIATE MEDICAL CARE IF:   Your baby is overly tired (lethargic) and does not want to wake up and feed.  Your baby develops an unexplained fever. Document Released: 01/31/2005 Document Revised: 10/03/2012 Document Reviewed: 07/25/2012 [facility]  Patient Information 2014 [place].

## 2013-03-27 NOTE — Progress Notes (Signed)
   Subjective:    Anne Phelps is a G1P0 6551w2d by LMP seen today for her first obstetrical visit.  Her obstetrical history is significant for ? autoimmune disorder. She had a full workup with rheumatology 2 years ago. She is not currently taking any medications and her last flare up was 2 years ago. She was ruled out for Lupus and RA. Patient intends to breast and bottle feed. Pregnancy history fully reviewed.  Patient reports no complaints.  There were no vitals filed for this visit.  HISTORY: OB History  Gravida Para Term Preterm AB SAB TAB Ectopic Multiple Living  1             # Outcome Date GA Lbr Len/2nd Weight Sex Delivery Anes PTL Lv  1 CUR              Past Medical History  Diagnosis Date  . Autoimmune disorder    No past surgical history on file. No family history on file.   Exam    Uterus:     Pelvic Exam:    Perineum: Normal Perineum   Vulva: normal   Vagina:  normal mucosa, normal discharge   pH:    Cervix: closed, long   Adnexa: no mass, fullness, tenderness   Bony Pelvis: android  System: Breast:  normal appearance, no masses or tenderness   Skin: normal coloration and turgor, no rashes    Neurologic: normal, no focal deficits   Extremities: normal strength, tone, and muscle mass   HEENT extra ocular movement intact   Mouth/Teeth mucous membranes moist, pharynx normal without lesions   Neck supple and no masses   Cardiovascular: regular rate and rhythm   Respiratory:  chest clear, no wheezing, crepitations, rhonchi, normal symmetric air entry   Abdomen: soft, non-tender; bowel sounds normal; no masses,  no organomegaly   Urinary:       Assessment:    Pregnancy: G1P0 Patient Active Problem List   Diagnosis Date Noted  . Supervision of normal first pregnancy 03/27/2013  . 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 Rh negative state in antepartum period 03/19/2013  . FEVER UNSPECIFIED 02/19/2010        Plan:     Initial labs drawn. Prenatal  vitamins. Problem list reviewed and updated. Genetic Screening discussed First Screen: undecided. Patient will discuss with partner, may consider Quad screen  Ultrasound discussed; fetal survey: requested. Will be ordered at a later visit  Follow up in 4 weeks. Monitor BP. Patient denies history of HTN 50% of 30 min visit spent on counseling and coordination of care.     Anne Phelps 03/27/2013

## 2013-03-27 NOTE — Progress Notes (Signed)
P-90 

## 2013-04-23 ENCOUNTER — Other Ambulatory Visit: Payer: Self-pay | Admitting: Obstetrics & Gynecology

## 2013-04-23 DIAGNOSIS — Z3682 Encounter for antenatal screening for nuchal translucency: Secondary | ICD-10-CM

## 2013-04-24 ENCOUNTER — Ambulatory Visit (INDEPENDENT_AMBULATORY_CARE_PROVIDER_SITE_OTHER): Payer: Medicaid Other | Admitting: Obstetrics & Gynecology

## 2013-04-24 VITALS — BP 137/88 | Wt 165.0 lb

## 2013-04-24 DIAGNOSIS — M359 Systemic involvement of connective tissue, unspecified: Secondary | ICD-10-CM

## 2013-04-24 DIAGNOSIS — Z3400 Encounter for supervision of normal first pregnancy, unspecified trimester: Secondary | ICD-10-CM

## 2013-04-24 DIAGNOSIS — D8989 Other specified disorders involving the immune mechanism, not elsewhere classified: Secondary | ICD-10-CM

## 2013-04-24 DIAGNOSIS — Z6791 Unspecified blood type, Rh negative: Secondary | ICD-10-CM

## 2013-04-24 DIAGNOSIS — O360990 Maternal care for other rhesus isoimmunization, unspecified trimester, not applicable or unspecified: Secondary | ICD-10-CM

## 2013-04-24 DIAGNOSIS — O26899 Other specified pregnancy related conditions, unspecified trimester: Secondary | ICD-10-CM

## 2013-04-24 NOTE — Progress Notes (Signed)
P=79 

## 2013-04-24 NOTE — Patient Instructions (Signed)
Return to clinic for any obstetric concerns or go to [facility] for evaluation  

## 2013-04-24 NOTE — Progress Notes (Signed)
First screen will be done tomorrow, AFP screen next visit here in clinic.  No other complaints or concerns.  Routine obstetric precautions reviewed.

## 2013-04-25 ENCOUNTER — Other Ambulatory Visit: Payer: Self-pay

## 2013-04-25 ENCOUNTER — Ambulatory Visit (HOSPITAL_COMMUNITY)
Admission: RE | Admit: 2013-04-25 | Discharge: 2013-04-25 | Disposition: A | Discharge status: Home / Self Care | Payer: Medicaid Other | Source: Ambulatory Visit | Attending: Obstetrics & Gynecology | Admitting: Obstetrics & Gynecology

## 2013-04-25 DIAGNOSIS — O3510X0 Maternal care for (suspected) chromosomal abnormality in fetus, unspecified, not applicable or unspecified: Secondary | ICD-10-CM | POA: Insufficient documentation

## 2013-04-25 DIAGNOSIS — Z3682 Encounter for antenatal screening for nuchal translucency: Secondary | ICD-10-CM

## 2013-04-25 DIAGNOSIS — Z3689 Encounter for other specified antenatal screening: Secondary | ICD-10-CM | POA: Insufficient documentation

## 2013-04-25 DIAGNOSIS — O360990 Maternal care for other rhesus isoimmunization, unspecified trimester, not applicable or unspecified: Secondary | ICD-10-CM | POA: Insufficient documentation

## 2013-04-25 DIAGNOSIS — O351XX0 Maternal care for (suspected) chromosomal abnormality in fetus, not applicable or unspecified: Secondary | ICD-10-CM | POA: Insufficient documentation

## 2013-04-30 ENCOUNTER — Encounter: Payer: Self-pay | Admitting: Obstetrics & Gynecology

## 2013-05-23 ENCOUNTER — Ambulatory Visit (INDEPENDENT_AMBULATORY_CARE_PROVIDER_SITE_OTHER): Payer: Medicaid Other | Admitting: Obstetrics & Gynecology

## 2013-05-23 VITALS — BP 117/73 | Wt 168.0 lb

## 2013-05-23 DIAGNOSIS — Z3400 Encounter for supervision of normal first pregnancy, unspecified trimester: Secondary | ICD-10-CM

## 2013-05-23 NOTE — Progress Notes (Signed)
P = 66 

## 2013-05-23 NOTE — Patient Instructions (Signed)
Return to clinic for any obstetric concerns or go to [facility] for evaluation  

## 2013-05-23 NOTE — Progress Notes (Signed)
Getting married on May 3rd, so excited! Normal first screen, AFP draw today Anatomy scan ordered for 20 weeks No other complaints or concerns.  Routine obstetric precautions reviewed.

## 2013-05-27 ENCOUNTER — Encounter: Payer: Self-pay | Admitting: Obstetrics & Gynecology

## 2013-05-27 LAB — ALPHA FETOPROTEIN, MATERNAL
AFP: 23.2 IU/mL
Curr Gest Age: 16.2 wks.days
MOM FOR AFP: 0.79
Open Spina bifida: NEGATIVE

## 2013-06-03 ENCOUNTER — Encounter: Payer: Self-pay | Admitting: *Deleted

## 2013-06-14 ENCOUNTER — Ambulatory Visit (HOSPITAL_COMMUNITY)
Admission: RE | Admit: 2013-06-14 | Discharge: 2013-06-14 | Disposition: A | Discharge status: Home / Self Care | Payer: No Typology Code available for payment source | Source: Ambulatory Visit | Attending: Obstetrics & Gynecology | Admitting: Obstetrics & Gynecology

## 2013-06-14 DIAGNOSIS — Z3400 Encounter for supervision of normal first pregnancy, unspecified trimester: Secondary | ICD-10-CM

## 2013-06-14 DIAGNOSIS — Z3689 Encounter for other specified antenatal screening: Secondary | ICD-10-CM | POA: Diagnosis present

## 2013-06-18 ENCOUNTER — Encounter: Payer: Self-pay | Admitting: Obstetrics & Gynecology

## 2013-06-21 ENCOUNTER — Ambulatory Visit (INDEPENDENT_AMBULATORY_CARE_PROVIDER_SITE_OTHER): Payer: Medicaid Other | Admitting: Obstetrics & Gynecology

## 2013-06-21 VITALS — BP 132/73 | HR 84 | Wt 171.0 lb

## 2013-06-21 DIAGNOSIS — IMO0002 Reserved for concepts with insufficient information to code with codable children: Secondary | ICD-10-CM

## 2013-06-21 DIAGNOSIS — Z3400 Encounter for supervision of normal first pregnancy, unspecified trimester: Secondary | ICD-10-CM

## 2013-06-21 DIAGNOSIS — Z0489 Encounter for examination and observation for other specified reasons: Secondary | ICD-10-CM

## 2013-06-21 NOTE — Progress Notes (Signed)
Wedding went well, had some sunburn on legs after lounging on the beach. Mild edema bilaterally.  Not concerning, patient was told she can wear OTC compression stocking for relief and put legs up. Edema will worsen with the enlarging gravid uterus; DVT precautions reviewed.  Inadequate anatomy scan at 19 weeks, rescan ordered ~ 24 weeks No other complaints or concerns.  Routine obstetric precautions reviewed.

## 2013-06-21 NOTE — Patient Instructions (Signed)
Return to clinic for any obstetric concerns or go to [facility] for evaluation  

## 2013-07-18 ENCOUNTER — Telehealth: Payer: Self-pay

## 2013-07-18 NOTE — Telephone Encounter (Signed)
Pt wants to know when she had a tetanus shot.  She is pregnant and her doctor advised her to get one.  She knows she had one here, but thinks it was 2-3 years ago.  I looked in Epic and didn't see one.  Please call her at [phone]

## 2013-07-19 ENCOUNTER — Ambulatory Visit (INDEPENDENT_AMBULATORY_CARE_PROVIDER_SITE_OTHER): Payer: Medicaid Other | Admitting: Family Medicine

## 2013-07-19 VITALS — BP 141/76 | HR 71 | Wt 175.0 lb

## 2013-07-19 DIAGNOSIS — Z3400 Encounter for supervision of normal first pregnancy, unspecified trimester: Secondary | ICD-10-CM

## 2013-07-19 DIAGNOSIS — Z6791 Unspecified blood type, Rh negative: Secondary | ICD-10-CM

## 2013-07-19 DIAGNOSIS — O26899 Other specified pregnancy related conditions, unspecified trimester: Principal | ICD-10-CM

## 2013-07-19 DIAGNOSIS — O360990 Maternal care for other rhesus isoimmunization, unspecified trimester, not applicable or unspecified: Secondary | ICD-10-CM

## 2013-07-19 NOTE — Telephone Encounter (Signed)
Called patient and informed her that tetanus shot was given 06/18/2009.

## 2013-07-19 NOTE — Progress Notes (Signed)
BP mildly elevated today Recheck BP 28 wks labs next visit with Rhogam and TDaP [place] to be done on 6/8.

## 2013-07-19 NOTE — Patient Instructions (Signed)
Second Trimester of Pregnancy The second trimester is from week 13 through week 28, months 4 through 6. The second trimester is often a time when you feel your best. Your body has also adjusted to being pregnant, and you begin to feel better physically. Usually, morning sickness has lessened or quit completely, you may have more energy, and you may have an increase in appetite. The second trimester is also a time when the fetus is growing rapidly. At the end of the sixth month, the fetus is about 9 inches long and weighs about 1 pounds. You will likely begin to feel the baby move (quickening) between 18 and 20 weeks of the pregnancy. BODY CHANGES Your body goes through many changes during pregnancy. The changes vary from woman to woman.   Your weight will continue to increase. You will notice your lower abdomen bulging out.  You may begin to get stretch marks on your hips, abdomen, and breasts.  You may develop headaches that can be relieved by medicines approved by your caregiver.  You may urinate more often because the fetus is pressing on your bladder.  You may develop or continue to have heartburn as a result of your pregnancy.  You may develop constipation because certain hormones are causing the muscles that push waste through your intestines to slow down.  You may develop hemorrhoids or swollen, bulging veins (varicose veins).  You may have back pain because of the weight gain and pregnancy hormones relaxing your joints between the bones in your pelvis and as a result of a shift in weight and the muscles that support your balance.  Your breasts will continue to grow and be tender.  Your gums may bleed and may be sensitive to brushing and flossing.  Dark spots or blotches (chloasma, mask of pregnancy) may develop on your face. This will likely fade after the baby is born.  A dark line from your belly button to the pubic area (linea nigra) may appear. This will likely fade after  the baby is born. WHAT TO EXPECT AT YOUR PRENATAL VISITS During a routine prenatal visit:  You will be weighed to make sure you and the fetus are growing normally.  Your blood pressure will be taken.  Your abdomen will be measured to track your baby's growth.  The fetal heartbeat will be listened to.  Any test results from the previous visit will be discussed. Your caregiver may ask you:  How you are feeling.  If you are feeling the baby move.  If you have had any abnormal symptoms, such as leaking fluid, bleeding, severe headaches, or abdominal cramping.  If you have any questions. Other tests that may be performed during your second trimester include:  Blood tests that check for:  Low iron levels (anemia).  Gestational diabetes (between 24 and 28 weeks).  Rh antibodies.  Urine tests to check for infections, diabetes, or protein in the urine.  An ultrasound to confirm the proper growth and development of the baby.  An amniocentesis to check for possible genetic problems.  Fetal screens for spina bifida and Down syndrome. HOME CARE INSTRUCTIONS   Avoid all smoking, herbs, alcohol, and unprescribed drugs. These chemicals affect the formation and growth of the baby.  Follow your caregiver's instructions regarding medicine use. There are medicines that are either safe or unsafe to take during pregnancy.  Exercise only as directed by your caregiver. Experiencing uterine cramps is a good sign to stop exercising.  Continue to eat regular,   healthy meals.  Wear a good support bra for breast tenderness.  Do not use hot tubs, steam rooms, or saunas.  Wear your seat belt at all times when driving.  Avoid raw meat, uncooked cheese, cat litter boxes, and soil used by cats. These carry germs that can cause birth defects in the baby.  Take your prenatal vitamins.  Try taking a stool softener (if your caregiver approves) if you develop constipation. Eat more high-fiber  foods, such as fresh vegetables or fruit and whole grains. Drink plenty of fluids to keep your urine clear or pale yellow.  Take warm sitz baths to soothe any pain or discomfort caused by hemorrhoids. Use hemorrhoid cream if your caregiver approves.  If you develop varicose veins, wear support hose. Elevate your feet for 15 minutes, 3 4 times a day. Limit salt in your diet.  Avoid heavy lifting, wear low heel shoes, and practice good posture.  Rest with your legs elevated if you have leg cramps or low back pain.  Visit your dentist if you have not gone yet during your pregnancy. Use a soft toothbrush to brush your teeth and be gentle when you floss.  A sexual relationship may be continued unless your caregiver directs you otherwise.  Continue to go to all your prenatal visits as directed by your caregiver. SEEK MEDICAL CARE IF:   You have dizziness.  You have mild pelvic cramps, pelvic pressure, or nagging pain in the abdominal area.  You have persistent nausea, vomiting, or diarrhea.  You have a bad smelling vaginal discharge.  You have pain with urination. SEEK IMMEDIATE MEDICAL CARE IF:   You have a fever.  You are leaking fluid from your vagina.  You have spotting or bleeding from your vagina.  You have severe abdominal cramping or pain.  You have rapid weight gain or loss.  You have shortness of breath with chest pain.  You notice sudden or extreme swelling of your face, hands, ankles, feet, or legs.  You have not felt your baby move in over an hour.  You have severe headaches that do not go away with medicine.  You have vision changes. Document Released: 01/25/2001 Document Revised: 10/03/2012 Document Reviewed: 04/03/2012 [employer] Patient Information 2014 [employer].  Breastfeeding Deciding to breastfeed is one of the best choices you can make for you and your baby. A change in hormones during pregnancy causes your breast tissue to grow and increases the  number and size of your milk ducts. These hormones also allow proteins, sugars, and fats from your blood supply to make breast milk in your milk-producing glands. Hormones prevent breast milk from being released before your baby is born as well as prompt milk flow after birth. Once breastfeeding has begun, thoughts of your baby, as well as his or her sucking or crying, can stimulate the release of milk from your milk-producing glands.  BENEFITS OF BREASTFEEDING For Your Baby  Your first milk (colostrum) helps your baby's digestive system function better.   There are antibodies in your milk that help your baby fight off infections.   Your baby has a lower incidence of asthma, allergies, and sudden infant death syndrome.   The nutrients in breast milk are better for your baby than infant formulas and are designed uniquely for your baby's needs.   Breast milk improves your baby's brain development.   Your baby is less likely to develop other conditions, such as childhood obesity, asthma, or type 2 diabetes mellitus.  For   You   Breastfeeding helps to create a very special bond between you and your baby.   Breastfeeding is convenient. Breast milk is always available at the correct temperature and costs nothing.   Breastfeeding helps to burn calories and helps you lose the weight gained during pregnancy.   Breastfeeding makes your uterus contract to its prepregnancy size faster and slows bleeding (lochia) after you give birth.   Breastfeeding helps to lower your risk of developing type 2 diabetes mellitus, osteoporosis, and breast or ovarian cancer later in life. SIGNS THAT YOUR BABY IS HUNGRY Early Signs of Hunger  Increased alertness or activity.  Stretching.  Movement of the head from side to side.  Movement of the head and opening of the mouth when the corner of the mouth or cheek is stroked (rooting).  Increased sucking sounds, smacking lips, cooing, sighing, or  squeaking.  Hand-to-mouth movements.  Increased sucking of fingers or hands. Late Signs of Hunger  Fussing.  Intermittent crying. Extreme Signs of Hunger Signs of extreme hunger will require calming and consoling before your baby will be able to breastfeed successfully. Do not wait for the following signs of extreme hunger to occur before you initiate breastfeeding:   Restlessness.  A loud, strong cry.   Screaming. BREASTFEEDING BASICS Breastfeeding Initiation  Find a comfortable place to sit or lie down, with your neck and back well supported.  Place a pillow or rolled up blanket under your baby to bring him or her to the level of your breast (if you are seated). Nursing pillows are specially designed to help support your arms and your baby while you breastfeed.  Make sure that your baby's abdomen is facing your abdomen.   Gently massage your breast. With your fingertips, massage from your chest wall toward your nipple in a circular motion. This encourages milk flow. You may need to continue this action during the feeding if your milk flows slowly.  Support your breast with 4 fingers underneath and your thumb above your nipple. Make sure your fingers are well away from your nipple and your baby's mouth.   Stroke your baby's lips gently with your finger or nipple.   When your baby's mouth is open wide enough, quickly bring your baby to your breast, placing your entire nipple and as much of the colored area around your nipple (areola) as possible into your baby's mouth.   More areola should be visible above your baby's upper lip than below the lower lip.   Your baby's tongue should be between his or her lower gum and your breast.   Ensure that your baby's mouth is correctly positioned around your nipple (latched). Your baby's lips should create a seal on your breast and be turned out (everted).  It is common for your baby to suck about 2 3 minutes in order to start the  flow of breast milk. Latching Teaching your baby how to latch on to your breast properly is very important. An improper latch can cause nipple pain and decreased milk supply for you and poor weight gain in your baby. Also, if your baby is not latched onto your nipple properly, he or she may swallow some air during feeding. This can make your baby fussy. Burping your baby when you switch breasts during the feeding can help to get rid of the air. However, teaching your baby to latch on properly is still the best way to prevent fussiness from swallowing air while breastfeeding. Signs that your baby has   successfully latched on to your nipple:    Silent tugging or silent sucking, without causing you pain.   Swallowing heard between every 3 4 sucks.    Muscle movement above and in front of his or her ears while sucking.  Signs that your baby has not successfully latched on to nipple:   Sucking sounds or smacking sounds from your baby while breastfeeding.  Nipple pain. If you think your baby has not latched on correctly, slip your finger into the corner of your baby's mouth to break the suction and place it between your baby's gums. Attempt breastfeeding initiation again. Signs of Successful Breastfeeding Signs from your baby:   A gradual decrease in the number of sucks or complete cessation of sucking.   Falling asleep.   Relaxation of his or her body.   Retention of a small amount of milk in his or her mouth.   Letting go of your breast by himself or herself. Signs from you:  Breasts that have increased in firmness, weight, and size 1 3 hours after feeding.   Breasts that are softer immediately after breastfeeding.  Increased milk volume, as well as a change in milk consistency and color by the 5th day of breastfeeding.   Nipples that are not sore, cracked, or bleeding. Signs That Your Baby is Getting Enough Milk  Wetting at least 3 diapers in a 24-hour period. The urine  should be clear and pale yellow by age 5 days.  At least 3 stools in a 24-hour period by age 5 days. The stool should be soft and yellow.  At least 3 stools in a 24-hour period by age 7 days. The stool should be seedy and yellow.  No loss of weight greater than 10% of birth weight during the first 3 days of age.  Average weight gain of 4 7 ounces (120 210 mL) per week after age 4 days.  Consistent daily weight gain by age 5 days, without weight loss after the age of 2 weeks. After a feeding, your baby may spit up a small amount. This is common. BREASTFEEDING FREQUENCY AND DURATION Frequent feeding will help you make more milk and can prevent sore nipples and breast engorgement. Breastfeed when you feel the need to reduce the fullness of your breasts or when your baby shows signs of hunger. This is called "breastfeeding on demand." Avoid introducing a pacifier to your baby while you are working to establish breastfeeding (the first 4 6 weeks after your baby is born). After this time you may choose to use a pacifier. Research has shown that pacifier use during the first year of a baby's life decreases the risk of sudden infant death syndrome (SIDS). Allow your baby to feed on each breast as long as he or she wants. Breastfeed until your baby is finished feeding. When your baby unlatches or falls asleep while feeding from the first breast, offer the second breast. Because newborns are often sleepy in the first few weeks of life, you may need to awaken your baby to get him or her to feed. Breastfeeding times will vary from baby to baby. However, the following rules can serve as a guide to help you ensure that your baby is properly fed:  Newborns (babies 4 weeks of age or younger) may breastfeed every 1 3 hours.  Newborns should not go longer than 3 hours during the day or 5 hours during the night without breastfeeding.  You should breastfeed your baby a minimum of   8 times in a 24-hour period until  you begin to introduce solid foods to your baby at around 6 months of age. BREAST MILK PUMPING Pumping and storing breast milk allows you to ensure that your baby is exclusively fed your breast milk, even at times when you are unable to breastfeed. This is especially important if you are going back to work while you are still breastfeeding or when you are not able to be present during feedings. Your lactation consultant can give you guidelines on how long it is safe to store breast milk.  A breast pump is a machine that allows you to pump milk from your breast into a sterile bottle. The pumped breast milk can then be stored in a refrigerator or freezer. Some breast pumps are operated by hand, while others use electricity. Ask your lactation consultant which type will work best for you. Breast pumps can be purchased, but some hospitals and breastfeeding support groups lease breast pumps on a monthly basis. A lactation consultant can teach you how to hand express breast milk, if you prefer not to use a pump.  CARING FOR YOUR BREASTS WHILE YOU BREASTFEED Nipples can become dry, cracked, and sore while breastfeeding. The following recommendations can help keep your breasts moisturized and healthy:  Avoid using soap on your nipples.   Wear a supportive bra. Although not required, special nursing bras and tank tops are designed to allow access to your breasts for breastfeeding without taking off your entire bra or top. Avoid wearing underwire style bras or extremely tight bras.  Air dry your nipples for 3 4minutes after each feeding.   Use only cotton bra pads to absorb leaked breast milk. Leaking of breast milk between feedings is normal.   Use lanolin on your nipples after breastfeeding. Lanolin helps to maintain your skin's normal moisture barrier. If you use pure lanolin you do not need to wash it off before feeding your baby again. Pure lanolin is not toxic to your baby. You may also hand express a  few drops of breast milk and gently massage that milk into your nipples and allow the milk to air dry. In the first few weeks after giving birth, some women experience extremely full breasts (engorgement). Engorgement can make your breasts feel heavy, warm, and tender to the touch. Engorgement peaks within 3 5 days after you give birth. The following recommendations can help ease engorgement:  Completely empty your breasts while breastfeeding or pumping. You may want to start by applying warm, moist heat (in the shower or with warm water-soaked hand towels) just before feeding or pumping. This increases circulation and helps the milk flow. If your baby does not completely empty your breasts while breastfeeding, pump any extra milk after he or she is finished.  Wear a snug bra (nursing or regular) or tank top for 1 2 days to signal your body to slightly decrease milk production.  Apply ice packs to your breasts, unless this is too uncomfortable for you.  Make sure that your baby is latched on and positioned properly while breastfeeding. If engorgement persists after 48 hours of following these recommendations, contact your health care provider or a lactation consultant. OVERALL HEALTH CARE RECOMMENDATIONS WHILE BREASTFEEDING  Eat healthy foods. Alternate between meals and snacks, eating 3 of each per day. Because what you eat affects your breast milk, some of the foods may make your baby more irritable than usual. Avoid eating these foods if you are sure that they are   negatively affecting your baby.  Drink milk, fruit juice, and water to satisfy your thirst (about 10 glasses a day).   Rest often, relax, and continue to take your prenatal vitamins to prevent fatigue, stress, and anemia.  Continue breast self-awareness checks.  Avoid chewing and smoking tobacco.  Avoid alcohol and drug use. Some medicines that may be harmful to your baby can pass through breast milk. It is important to ask your  health care provider before taking any medicine, including all over-the-counter and prescription medicine as well as vitamin and herbal supplements. It is possible to become pregnant while breastfeeding. If birth control is desired, ask your health care provider about options that will be safe for your baby. SEEK MEDICAL CARE IF:   You feel like you want to stop breastfeeding or have become frustrated with breastfeeding.  You have painful breasts or nipples.  Your nipples are cracked or bleeding.  Your breasts are red, tender, or warm.  You have a swollen area on either breast.  You have a fever or chills.  You have nausea or vomiting.  You have drainage other than breast milk from your nipples.  Your breasts do not become full before feedings by the 5th day after you give birth.  You feel sad and depressed.  Your baby is too sleepy to eat well.  Your baby is having trouble sleeping.   Your baby is wetting less than 3 diapers in a 24-hour period.  Your baby has less than 3 stools in a 24-hour period.  Your baby's skin or the white part of his or her eyes becomes yellow.   Your baby is not gaining weight by 5 days of age. SEEK IMMEDIATE MEDICAL CARE IF:   Your baby is overly tired (lethargic) and does not want to wake up and feed.  Your baby develops an unexplained fever. Document Released: 01/31/2005 Document Revised: 10/03/2012 Document Reviewed: 07/25/2012 [employer] Patient Information 2014 [employer].  

## 2013-07-22 ENCOUNTER — Ambulatory Visit (HOSPITAL_COMMUNITY)
Admission: RE | Admit: 2013-07-22 | Discharge: 2013-07-22 | Disposition: A | Discharge status: Home / Self Care | Payer: No Typology Code available for payment source | Source: Ambulatory Visit | Attending: Obstetrics & Gynecology | Admitting: Obstetrics & Gynecology

## 2013-07-22 VITALS — BP 125/72 | HR 85 | Wt 174.2 lb

## 2013-07-22 DIAGNOSIS — Z1389 Encounter for screening for other disorder: Secondary | ICD-10-CM | POA: Insufficient documentation

## 2013-07-22 DIAGNOSIS — Z3689 Encounter for other specified antenatal screening: Secondary | ICD-10-CM | POA: Insufficient documentation

## 2013-07-22 DIAGNOSIS — IMO0002 Reserved for concepts with insufficient information to code with codable children: Secondary | ICD-10-CM

## 2013-07-22 DIAGNOSIS — Z6791 Unspecified blood type, Rh negative: Secondary | ICD-10-CM

## 2013-07-22 DIAGNOSIS — Z3400 Encounter for supervision of normal first pregnancy, unspecified trimester: Secondary | ICD-10-CM

## 2013-07-22 DIAGNOSIS — Z0489 Encounter for examination and observation for other specified reasons: Secondary | ICD-10-CM

## 2013-07-22 DIAGNOSIS — O26899 Other specified pregnancy related conditions, unspecified trimester: Secondary | ICD-10-CM

## 2013-07-22 DIAGNOSIS — Z363 Encounter for antenatal screening for malformations: Secondary | ICD-10-CM | POA: Insufficient documentation

## 2013-07-23 ENCOUNTER — Encounter: Payer: Self-pay | Admitting: Obstetrics & Gynecology

## 2013-08-14 ENCOUNTER — Ambulatory Visit (INDEPENDENT_AMBULATORY_CARE_PROVIDER_SITE_OTHER): Payer: Medicaid Other | Admitting: Obstetrics & Gynecology

## 2013-08-14 ENCOUNTER — Encounter: Payer: Self-pay | Admitting: Obstetrics & Gynecology

## 2013-08-14 VITALS — BP 117/76 | HR 74 | Wt 179.0 lb

## 2013-08-14 DIAGNOSIS — O360131 Maternal care for anti-D [Rh] antibodies, third trimester, fetus 1: Secondary | ICD-10-CM

## 2013-08-14 DIAGNOSIS — Z3403 Encounter for supervision of normal first pregnancy, third trimester: Secondary | ICD-10-CM

## 2013-08-14 DIAGNOSIS — Z23 Encounter for immunization: Secondary | ICD-10-CM

## 2013-08-14 DIAGNOSIS — O3090 Multiple gestation, unspecified, unspecified trimester: Secondary | ICD-10-CM

## 2013-08-14 DIAGNOSIS — O360121 Maternal care for anti-D [Rh] antibodies, second trimester, fetus 1: Secondary | ICD-10-CM

## 2013-08-14 DIAGNOSIS — O360990 Maternal care for other rhesus isoimmunization, unspecified trimester, not applicable or unspecified: Secondary | ICD-10-CM

## 2013-08-14 DIAGNOSIS — Z3400 Encounter for supervision of normal first pregnancy, unspecified trimester: Secondary | ICD-10-CM

## 2013-08-14 LAB — CBC WITH DIFFERENTIAL/PLATELET
BASOS PCT: 0 % (ref 0–1)
Basophils Absolute: 0 10*3/uL (ref 0.0–0.1)
Eosinophils Absolute: 0.1 10*3/uL (ref 0.0–0.7)
Eosinophils Relative: 1 % (ref 0–5)
HEMATOCRIT: 29.8 % — AB (ref 36.0–46.0)
HEMOGLOBIN: 10.1 g/dL — AB (ref 12.0–15.0)
LYMPHS ABS: 1.6 10*3/uL (ref 0.7–4.0)
LYMPHS PCT: 12 % (ref 12–46)
MCH: 29 pg (ref 26.0–34.0)
MCHC: 33.9 g/dL (ref 30.0–36.0)
MCV: 85.6 fL (ref 78.0–100.0)
MONO ABS: 0.4 10*3/uL (ref 0.1–1.0)
MONOS PCT: 3 % (ref 3–12)
NEUTROS ABS: 10.9 10*3/uL — AB (ref 1.7–7.7)
NEUTROS PCT: 84 % — AB (ref 43–77)
Platelets: 219 10*3/uL (ref 150–400)
RBC: 3.48 MIL/uL — AB (ref 3.87–5.11)
RDW: 13.3 % (ref 11.5–15.5)
WBC: 13 10*3/uL — AB (ref 4.0–10.5)

## 2013-08-14 MED ORDER — RHO D IMMUNE GLOBULIN 1500 UNIT/2ML IJ SOSY
300.0000 ug | PREFILLED_SYRINGE | Freq: Once | INTRAMUSCULAR | Status: AC
  Administered 2013-08-14: 300 ug via INTRAMUSCULAR

## 2013-08-14 NOTE — Progress Notes (Signed)
Rhogam and Tdap today 1 hour GTT today BP improved.  Continue to follow. F/u on 2 weeks

## 2013-08-14 NOTE — Addendum Note (Signed)
Addended by: Barbara CowerNOGUES, Avo Schlachter L on: 08/14/2013 11:43 AM   Modules accepted: Orders

## 2013-08-14 NOTE — Patient Instructions (Addendum)
Tetanus, Diphtheria, Pertussis (Tdap) Vaccine What You Need to Know WHY GET VACCINATED? Tetanus, diphtheria and pertussis can be very serious diseases, even for adolescents and adults. Tdap vaccine can protect [place] from these diseases. TETANUS (Lockjaw) causes painful muscle tightening and stiffness, usually all over the body.  It can lead to tightening of muscles in the head and neck so you can't open your mouth, swallow, or sometimes even breathe. Tetanus kills about 1 out of 5 people who are infected. DIPHTHERIA can cause a thick coating to form in the back of the throat.  It can lead to breathing problems, paralysis, heart failure, and death. PERTUSSIS (Whooping Cough) causes severe coughing spells, which can cause difficulty breathing, vomiting and disturbed sleep.  It can also lead to weight loss, incontinence, and rib fractures. Up to 2 in 100 adolescents and 5 in 100 adults with pertussis are hospitalized or have complications, which could include pneumonia and death. These diseases are caused by bacteria. Diphtheria and pertussis are spread from person to person through coughing or sneezing. Tetanus enters the body through cuts, scratches, or wounds. Before vaccines, the [place] saw as many as 200,000 cases a year of diphtheria and pertussis, and hundreds of cases of tetanus. Since vaccination began, tetanus and diphtheria have dropped by about 99% and pertussis by about 80%. TDAP VACCINE Tdap vaccine can protect adolescents and adults from tetanus, diphtheria, and pertussis. One dose of Tdap is routinely given at age 36 or 1. People who did not get Tdap at that age should get it as soon as possible. Tdap is especially important for health care professionals and anyone having close contact with a baby younger than 12 months. Pregnant women should get a dose of Tdap during every pregnancy, to protect the newborn from pertussis. Infants are most at risk for severe, life-threatening  complications from pertussis. A similar vaccine, called Td, protects from tetanus and diphtheria, but not pertussis. A Td booster should be given every 10 years. Tdap may be given as one of these boosters if you have not already gotten a dose. Tdap may also be given after a severe cut or burn to prevent tetanus infection. Your doctor can give you more information. Tdap may safely be given at the same time as other vaccines. SOME PEOPLE SHOULD NOT GET THIS VACCINE  If you ever had a life-threatening allergic reaction after a dose of any tetanus, diphtheria, or pertussis containing vaccine, OR if you have a severe allergy to any part of this vaccine, you should not get Tdap. Tell your doctor if you have any severe allergies.  If you had a coma, or long or multiple seizures within 7 days after a childhood dose of DTP or DTaP, you should not get Tdap, unless a cause other than the vaccine was found. You can still get Td.  Talk to your doctor if you:  have epilepsy or another nervous system problem,  had severe pain or swelling after any vaccine containing diphtheria, tetanus or pertussis,  ever had Guillain-Barr Syndrome (GBS),  aren't feeling well on the day the shot is scheduled. RISKS OF A VACCINE REACTION With any medicine, including vaccines, there is a chance of side effects. These are usually mild and go away on their own, but serious reactions are also possible. Brief fainting spells can follow a vaccination, leading to injuries from falling. Sitting or lying down for about 15 minutes can help prevent these. Tell your doctor if you feel dizzy or light-headed, or  have vision changes or ringing in the ears. Mild problems following Tdap (Did not interfere with activities)  Pain where the shot was given (about 3 in 4 adolescents or 2 in 3 adults)  Redness or swelling where the shot was given (about 1 person in 5)  Mild fever of at least 100.74F (up to about 1 in 25 adolescents or 1 in  100 adults)  Headache (about 3 or 4 people in 10)  Tiredness (about 1 person in 3 or 4)  Nausea, vomiting, diarrhea, stomach ache (up to 1 in 4 adolescents or 1 in 10 adults)  Chills, body aches, sore joints, rash, swollen glands (uncommon) Moderate problems following Tdap (Interfered with activities, but did not require medical attention)  Pain where the shot was given (about 1 in 5 adolescents or 1 in 100 adults)  Redness or swelling where the shot was given (up to about 1 in 16 adolescents or 1 in 25 adults)  Fever over 102F (about 1 in 100 adolescents or 1 in 250 adults)  Headache (about 3 in 20 adolescents or 1 in 10 adults)  Nausea, vomiting, diarrhea, stomach ache (up to 1 or 3 people in 100)  Swelling of the entire arm where the shot was given (up to about 3 in 100). Severe problems following Tdap (Unable to perform usual activities, required medical attention)  Swelling, severe pain, bleeding and redness in the arm where the shot was given (rare). A severe allergic reaction could occur after any vaccine (estimated less than 1 in a million doses). WHAT IF THERE IS A SERIOUS REACTION? What should I look for?  Look for anything that concerns you, such as signs of a severe allergic reaction, very high fever, or behavior changes. Signs of a severe allergic reaction can include hives, swelling of the face and throat, difficulty breathing, a fast heartbeat, dizziness, and weakness. These would start a few minutes to a few hours after the vaccination. What should I do?  If you think it is a severe allergic reaction or other emergency that can't wait, call 9-1-1 or get the person to the nearest hospital. Otherwise, call your doctor.  Afterward, the reaction should be reported to the "Vaccine Adverse Event Reporting System" (VAERS). Your doctor might file this report, or you can do it yourself through the VAERS web site at www.vaers.LA[place]ents.nohhs.gov, or by calling 1-(204)026-4484. VAERS is  only for reporting reactions. They do not give medical advice.  THE NATIONAL VACCINE INJURY COMPENSATION PROGRAM The National Vaccine Injury Compensation Program (VICP) is a federal program that was created to compensate people who may have been injured by certain vaccines. Persons who believe they may have been injured by a vaccine can learn about the program and about filing a claim by calling 1-(928)354-4264 or visiting the VICP website at SpiritualWord.atwww.hrsa.gov/vaccinecompensation. HOW CAN I LEARN MORE?  Ask your doctor.  Call your local or state health department.  Contact the Centers for Disease Control and Prevention (CDC):  Call 732-830-11141-414-698-7782 or visit CDC's website at PicCapture.uywww.cdc.gov/vaccines. CDC Tdap Vaccine VIS (06/23/11) Document Released: 08/02/2011 Document Revised: 05/28/2012 Document Reviewed: 05/23/2012 Exit[employer] Patient Information 2015 [employer]. This information is not intended to replace advice given to you by your health care provider. Make sure you discuss any questions you have with your health care provider.  Rh0 [D] Immune Globulin injection (Rhogam) What is this medicine? RhO [D] IMMUNE GLOBULIN (i MYOON GLOB yoo lin) is used to treat idiopathic thrombocytopenic purpura (ITP). This medicine is used  in RhO negative mothers who are pregnant with a RhO positive child. It is also used after a transfusion of RhO positive blood into a RhO negative person. This medicine may be used for other purposes; ask your health care provider or pharmacist if you have questions. COMMON BRAND NAME(S): BayRho-D, HyperRHO S/D, MICRhoGAM, RhoGAM, Rhophylac, WinRho SDF What should I tell my health care provider before I take this medicine? They need to know if you have any of these conditions: -bleeding disorders -low levels of immunoglobulin A in the body -no spleen -an unusual or allergic reaction to human immune globulin, other medicines, foods, dyes, or preservatives -pregnant or trying to  get pregnant -breast-feeding How should I use this medicine? This medicine is for injection into a muscle or into a vein. It is given by a health care professional in a hospital or clinic setting. Talk to your pediatrician regarding the use of this medicine in children. This medicine is not approved for use in children. Overdosage: If you think you have taken too much of this medicine contact a poison control center or emergency room at once. NOTE: This medicine is only for you. Do not share this medicine with others. What if I miss a dose? It is important not to miss your dose. Call your doctor or health care professional if you are unable to keep an appointment. What may interact with this medicine? -live virus vaccines, like measles, mumps, or rubella This list may not describe all possible interactions. Give your health care provider a list of all the medicines, herbs, non-prescription drugs, or dietary supplements you use. Also tell them if you smoke, drink alcohol, or use illegal drugs. Some items may interact with your medicine. What should I watch for while using this medicine? This medicine is made from human blood. It may be possible to pass an infection in this medicine. Talk to your doctor about the risks and benefits of this medicine. This medicine may interfere with live virus vaccines. Before you get live virus vaccines tell your health care professional if you have received this medicine within the past 3 months. What side effects may I notice from receiving this medicine? Side effects that you should report to your doctor or health care professional as soon as possible: -allergic reactions like skin rash, itching or hives, swelling of the face, lips, or tongue -breathing problems -chest pain or tightness -yellowing of the eyes or skin Side effects that usually do not require medical attention (report to your doctor or health care professional if they continue or are  bothersome): -fever -pain and tenderness at site where injected This list may not describe all possible side effects. Call your doctor for medical advice about side effects. You may report side effects to FDA at 1-800-FDA-1088. Where should I keep my medicine? This drug is given in a hospital or clinic and will not be stored at home. NOTE: This sheet is a summary. It may not cover all possible information. If you have questions about this medicine, talk to your doctor, pharmacist, or health care provider.  2015, [employer]. (2007-10-01 14:06:10)

## 2013-08-15 ENCOUNTER — Encounter: Payer: Medicaid Other | Admitting: Obstetrics & Gynecology

## 2013-08-15 ENCOUNTER — Encounter: Payer: Self-pay | Admitting: Obstetrics & Gynecology

## 2013-08-15 LAB — GLUCOSE TOLERANCE, 1 HOUR (50G) W/O FASTING: GLUCOSE 1 HOUR GTT: 160 mg/dL — AB (ref 70–140)

## 2013-08-15 LAB — HIV ANTIBODY (ROUTINE TESTING W REFLEX): HIV: NONREACTIVE

## 2013-08-15 LAB — RPR

## 2013-08-22 ENCOUNTER — Other Ambulatory Visit (INDEPENDENT_AMBULATORY_CARE_PROVIDER_SITE_OTHER): Payer: No Typology Code available for payment source | Admitting: *Deleted

## 2013-08-22 DIAGNOSIS — R7309 Other abnormal glucose: Secondary | ICD-10-CM

## 2013-08-22 DIAGNOSIS — O99810 Abnormal glucose complicating pregnancy: Secondary | ICD-10-CM

## 2013-08-22 NOTE — Progress Notes (Signed)
Pt is here today for a 3 hr GTT.

## 2013-08-23 ENCOUNTER — Encounter: Payer: Self-pay | Admitting: Obstetrics & Gynecology

## 2013-08-23 DIAGNOSIS — O99810 Abnormal glucose complicating pregnancy: Secondary | ICD-10-CM | POA: Insufficient documentation

## 2013-08-23 LAB — GLUCOSE TOLERANCE, 3 HOURS
GLUCOSE 3 HOUR GTT: 121 mg/dL (ref 70–144)
GLUCOSE, 1 HOUR-GESTATIONAL: 169 mg/dL (ref 70–189)
GLUCOSE, 2 HOUR-GESTATIONAL: 163 mg/dL (ref 70–164)
GLUCOSE, FASTING-GESTATIONAL: 77 mg/dL (ref 70–104)

## 2013-08-29 ENCOUNTER — Ambulatory Visit (INDEPENDENT_AMBULATORY_CARE_PROVIDER_SITE_OTHER): Payer: No Typology Code available for payment source | Admitting: Obstetrics & Gynecology

## 2013-08-29 VITALS — BP 137/77 | HR 70 | Wt 180.0 lb

## 2013-08-29 DIAGNOSIS — O99810 Abnormal glucose complicating pregnancy: Secondary | ICD-10-CM

## 2013-08-29 DIAGNOSIS — Z3400 Encounter for supervision of normal first pregnancy, unspecified trimester: Secondary | ICD-10-CM

## 2013-08-29 DIAGNOSIS — Z3403 Encounter for supervision of normal first pregnancy, third trimester: Secondary | ICD-10-CM

## 2013-08-29 DIAGNOSIS — K644 Residual hemorrhoidal skin tags: Secondary | ICD-10-CM

## 2013-08-29 NOTE — Patient Instructions (Signed)
Return to clinic for any obstetric concerns or go to [facility] for evaluation  

## 2013-08-29 NOTE — Progress Notes (Signed)
Noted some hemorrhoids, not symptomatic. No treatment needed for now. Normal 3 hr GTT; emphasized avoidance of refined carbohydrates and balanced diet as she is still at risk for macrosomia No other complaints or concerns.  Fetal movement and labor precautions reviewed.

## 2013-09-12 ENCOUNTER — Ambulatory Visit (INDEPENDENT_AMBULATORY_CARE_PROVIDER_SITE_OTHER): Payer: No Typology Code available for payment source | Admitting: Family Medicine

## 2013-09-12 VITALS — BP 130/74 | HR 81 | Wt 181.0 lb

## 2013-09-12 DIAGNOSIS — Z3400 Encounter for supervision of normal first pregnancy, unspecified trimester: Secondary | ICD-10-CM

## 2013-09-12 DIAGNOSIS — Z3403 Encounter for supervision of normal first pregnancy, third trimester: Secondary | ICD-10-CM

## 2013-09-12 NOTE — Patient Instructions (Signed)
Third Trimester of Pregnancy The third trimester is from week 29 through week 42, months 7 through 9. The third trimester is a time when the fetus is growing rapidly. At the end of the ninth month, the fetus is about 20 inches in length and weighs 6-10 pounds.  BODY CHANGES Your body goes through many changes during pregnancy. The changes vary from woman to woman.   Your weight will continue to increase. You can expect to gain 25-35 pounds (11-16 kg) by the end of the pregnancy.  You may begin to get stretch marks on your hips, abdomen, and breasts.  You may urinate more often because the fetus is moving lower into your pelvis and pressing on your bladder.  You may develop or continue to have heartburn as a result of your pregnancy.  You may develop constipation because certain hormones are causing the muscles that push waste through your intestines to slow down.  You may develop hemorrhoids or swollen, bulging veins (varicose veins).  You may have pelvic pain because of the weight gain and pregnancy hormones relaxing your joints between the bones in your pelvis. Backaches may result from overexertion of the muscles supporting your posture.  You may have changes in your hair. These can include thickening of your hair, rapid growth, and changes in texture. Some women also have hair loss during or after pregnancy, or hair that feels dry or thin. Your hair will most likely return to normal after your baby is born.  Your breasts will continue to grow and be tender. A yellow discharge may leak from your breasts called colostrum.  Your belly button may stick out.  You may feel short of breath because of your expanding uterus.  You may notice the fetus "dropping," or moving lower in your abdomen.  You may have a bloody mucus discharge. This usually occurs a few days to a week before labor begins.  Your cervix becomes thin and soft (effaced) near your due date. WHAT TO EXPECT AT YOUR  PRENATAL EXAMS  You will have prenatal exams every 2 weeks until week 36. Then, you will have weekly prenatal exams. During a routine prenatal visit:  You will be weighed to make sure you and the fetus are growing normally.  Your blood pressure is taken.  Your abdomen will be measured to track your baby's growth.  The fetal heartbeat will be listened to.  Any test results from the previous visit will be discussed.  You may have a cervical check near your due date to see if you have effaced. At around 36 weeks, your caregiver will check your cervix. At the same time, your caregiver will also perform a test on the secretions of the vaginal tissue. This test is to determine if a type of bacteria, Group B streptococcus, is present. Your caregiver will explain this further. Your caregiver may ask you:  What your birth plan is.  How you are feeling.  If you are feeling the baby move.  If you have had any abnormal symptoms, such as leaking fluid, bleeding, severe headaches, or abdominal cramping.  If you have any questions. Other tests or screenings that may be performed during your third trimester include:  Blood tests that check for low iron levels (anemia).  Fetal testing to check the health, activity level, and growth of the fetus. Testing is done if you have certain medical conditions or if there are problems during the pregnancy. FALSE LABOR You may feel small, irregular contractions that   eventually go away. These are called Braxton Hicks contractions, or false labor. Contractions may last for hours, days, or even weeks before true labor sets in. If contractions come at regular intervals, intensify, or become painful, it is best to be seen by your caregiver.  SIGNS OF LABOR   Menstrual-like cramps.  Contractions that are 5 minutes apart or less.  Contractions that start on the top of the uterus and spread down to the lower abdomen and back.  A sense of increased pelvic  pressure or back pain.  A watery or bloody mucus discharge that comes from the vagina. If you have any of these signs before the 37th week of pregnancy, call your caregiver right away. You need to go to the hospital to get checked immediately. HOME CARE INSTRUCTIONS   Avoid all smoking, herbs, alcohol, and unprescribed drugs. These chemicals affect the formation and growth of the baby.  Follow your caregiver's instructions regarding medicine use. There are medicines that are either safe or unsafe to take during pregnancy.  Exercise only as directed by your caregiver. Experiencing uterine cramps is a good sign to stop exercising.  Continue to eat regular, healthy meals.  Wear a good support bra for breast tenderness.  Do not use hot tubs, steam rooms, or saunas.  Wear your seat belt at all times when driving.  Avoid raw meat, uncooked cheese, cat litter boxes, and soil used by cats. These carry germs that can cause birth defects in the baby.  Take your prenatal vitamins.  Try taking a stool softener (if your caregiver approves) if you develop constipation. Eat more high-fiber foods, such as fresh vegetables or fruit and whole grains. Drink plenty of fluids to keep your urine clear or pale yellow.  Take warm sitz baths to soothe any pain or discomfort caused by hemorrhoids. Use hemorrhoid cream if your caregiver approves.  If you develop varicose veins, wear support hose. Elevate your feet for 15 minutes, 3-4 times a day. Limit salt in your diet.  Avoid heavy lifting, wear low heal shoes, and practice good posture.  Rest a lot with your legs elevated if you have leg cramps or low back pain.  Visit your dentist if you have not gone during your pregnancy. Use a soft toothbrush to brush your teeth and be gentle when you floss.  A sexual relationship may be continued unless your caregiver directs you otherwise.  Do not travel far distances unless it is absolutely necessary and only  with the approval of your caregiver.  Take prenatal classes to understand, practice, and ask questions about the labor and delivery.  Make a trial run to the hospital.  Pack your hospital bag.  Prepare the baby's nursery.  Continue to go to all your prenatal visits as directed by your caregiver. SEEK MEDICAL CARE IF:  You are unsure if you are in labor or if your water has broken.  You have dizziness.  You have mild pelvic cramps, pelvic pressure, or nagging pain in your abdominal area.  You have persistent nausea, vomiting, or diarrhea.  You have a bad smelling vaginal discharge.  You have pain with urination. SEEK IMMEDIATE MEDICAL CARE IF:   You have a fever.  You are leaking fluid from your vagina.  You have spotting or bleeding from your vagina.  You have severe abdominal cramping or pain.  You have rapid weight loss or gain.  You have shortness of breath with chest pain.  You notice sudden or extreme swelling   of your face, hands, ankles, feet, or legs.  You have not felt your baby move in over an hour.  You have severe headaches that do not go away with medicine.  You have vision changes. Document Released: 01/25/2001 Document Revised: 02/05/2013 Document Reviewed: 04/03/2012 [employer] Patient Information 2015 [employer]. This information is not intended to replace advice given to you by your health care provider. Make sure you discuss any questions you have with your health care provider.  Breastfeeding Deciding to breastfeed is one of the best choices you can make for you and your baby. A change in hormones during pregnancy causes your breast tissue to grow and increases the number and size of your milk ducts. These hormones also allow proteins, sugars, and fats from your blood supply to make breast milk in your milk-producing glands. Hormones prevent breast milk from being released before your baby is born as well as prompt milk flow after birth. Once  breastfeeding has begun, thoughts of your baby, as well as his or her sucking or crying, can stimulate the release of milk from your milk-producing glands.  BENEFITS OF BREASTFEEDING For Your Baby  Your first milk (colostrum) helps your baby's digestive system function better.   There are antibodies in your milk that help your baby fight off infections.   Your baby has a lower incidence of asthma, allergies, and sudden infant death syndrome.   The nutrients in breast milk are better for your baby than infant formulas and are designed uniquely for your baby's needs.   Breast milk improves your baby's brain development.   Your baby is less likely to develop other conditions, such as childhood obesity, asthma, or type 2 diabetes mellitus.  For You   Breastfeeding helps to create a very special bond between you and your baby.   Breastfeeding is convenient. Breast milk is always available at the correct temperature and costs nothing.   Breastfeeding helps to burn calories and helps you lose the weight gained during pregnancy.   Breastfeeding makes your uterus contract to its prepregnancy size faster and slows bleeding (lochia) after you give birth.   Breastfeeding helps to lower your risk of developing type 2 diabetes mellitus, osteoporosis, and breast or ovarian cancer later in life. SIGNS THAT YOUR BABY IS HUNGRY Early Signs of Hunger  Increased alertness or activity.  Stretching.  Movement of the head from side to side.  Movement of the head and opening of the mouth when the corner of the mouth or cheek is stroked (rooting).  Increased sucking sounds, smacking lips, cooing, sighing, or squeaking.  Hand-to-mouth movements.  Increased sucking of fingers or hands. Late Signs of Hunger  Fussing.  Intermittent crying. Extreme Signs of Hunger Signs of extreme hunger will require calming and consoling before your baby will be able to breastfeed successfully. Do not  wait for the following signs of extreme hunger to occur before you initiate breastfeeding:   Restlessness.  A loud, strong cry.   Screaming. BREASTFEEDING BASICS Breastfeeding Initiation  Find a comfortable place to sit or lie down, with your neck and back well supported.  Place a pillow or rolled up blanket under your baby to bring him or her to the level of your breast (if you are seated). Nursing pillows are specially designed to help support your arms and your baby while you breastfeed.  Make sure that your baby's abdomen is facing your abdomen.   Gently massage your breast. With your fingertips, massage from your chest   wall toward your nipple in a circular motion. This encourages milk flow. You may need to continue this action during the feeding if your milk flows slowly.  Support your breast with 4 fingers underneath and your thumb above your nipple. Make sure your fingers are well away from your nipple and your baby's mouth.   Stroke your baby's lips gently with your finger or nipple.   When your baby's mouth is open wide enough, quickly bring your baby to your breast, placing your entire nipple and as much of the colored area around your nipple (areola) as possible into your baby's mouth.   More areola should be visible above your baby's upper lip than below the lower lip.   Your baby's tongue should be between his or her lower gum and your breast.   Ensure that your baby's mouth is correctly positioned around your nipple (latched). Your baby's lips should create a seal on your breast and be turned out (everted).  It is common for your baby to suck about 2-3 minutes in order to start the flow of breast milk. Latching Teaching your baby how to latch on to your breast properly is very important. An improper latch can cause nipple pain and decreased milk supply for you and poor weight gain in your baby. Also, if your baby is not latched onto your nipple properly, he or she  may swallow some air during feeding. This can make your baby fussy. Burping your baby when you switch breasts during the feeding can help to get rid of the air. However, teaching your baby to latch on properly is still the best way to prevent fussiness from swallowing air while breastfeeding. Signs that your baby has successfully latched on to your nipple:    Silent tugging or silent sucking, without causing you pain.   Swallowing heard between every 3-4 sucks.    Muscle movement above and in front of his or her ears while sucking.  Signs that your baby has not successfully latched on to nipple:   Sucking sounds or smacking sounds from your baby while breastfeeding.  Nipple pain. If you think your baby has not latched on correctly, slip your finger into the corner of your baby's mouth to break the suction and place it between your baby's gums. Attempt breastfeeding initiation again. Signs of Successful Breastfeeding Signs from your baby:   A gradual decrease in the number of sucks or complete cessation of sucking.   Falling asleep.   Relaxation of his or her body.   Retention of a small amount of milk in his or her mouth.   Letting go of your breast by himself or herself. Signs from you:  Breasts that have increased in firmness, weight, and size 1-3 hours after feeding.   Breasts that are softer immediately after breastfeeding.  Increased milk volume, as well as a change in milk consistency and color by the fifth day of breastfeeding.   Nipples that are not sore, cracked, or bleeding. Signs That Your Baby is Getting Enough Milk  Wetting at least 3 diapers in a 24-hour period. The urine should be clear and pale yellow by age 5 days.  At least 3 stools in a 24-hour period by age 5 days. The stool should be soft and yellow.  At least 3 stools in a 24-hour period by age 7 days. The stool should be seedy and yellow.  No loss of weight greater than 10% of birth weight  during the first 3   days of age.  Average weight gain of 4-7 ounces (113-198 g) per week after age 4 days.  Consistent daily weight gain by age 5 days, without weight loss after the age of 2 weeks. After a feeding, your baby may spit up a small amount. This is common. BREASTFEEDING FREQUENCY AND DURATION Frequent feeding will help you make more milk and can prevent sore nipples and breast engorgement. Breastfeed when you feel the need to reduce the fullness of your breasts or when your baby shows signs of hunger. This is called "breastfeeding on demand." Avoid introducing a pacifier to your baby while you are working to establish breastfeeding (the first 4-6 weeks after your baby is born). After this time you may choose to use a pacifier. Research has shown that pacifier use during the first year of a baby's life decreases the risk of sudden infant death syndrome (SIDS). Allow your baby to feed on each breast as long as he or she wants. Breastfeed until your baby is finished feeding. When your baby unlatches or falls asleep while feeding from the first breast, offer the second breast. Because newborns are often sleepy in the first few weeks of life, you may need to awaken your baby to get him or her to feed. Breastfeeding times will vary from baby to baby. However, the following rules can serve as a guide to help you ensure that your baby is properly fed:  Newborns (babies 4 weeks of age or younger) may breastfeed every 1-3 hours.  Newborns should not go longer than 3 hours during the day or 5 hours during the night without breastfeeding.  You should breastfeed your baby a minimum of 8 times in a 24-hour period until you begin to introduce solid foods to your baby at around 6 months of age. BREAST MILK PUMPING Pumping and storing breast milk allows you to ensure that your baby is exclusively fed your breast milk, even at times when you are unable to breastfeed. This is especially important if you are  going back to work while you are still breastfeeding or when you are not able to be present during feedings. Your lactation consultant can give you guidelines on how long it is safe to store breast milk.  A breast pump is a machine that allows you to pump milk from your breast into a sterile bottle. The pumped breast milk can then be stored in a refrigerator or freezer. Some breast pumps are operated by hand, while others use electricity. Ask your lactation consultant which type will work best for you. Breast pumps can be purchased, but some hospitals and breastfeeding support groups lease breast pumps on a monthly basis. A lactation consultant can teach you how to hand express breast milk, if you prefer not to use a pump.  CARING FOR YOUR BREASTS WHILE YOU BREASTFEED Nipples can become dry, cracked, and sore while breastfeeding. The following recommendations can help keep your breasts moisturized and healthy:  Avoid using soap on your nipples.   Wear a supportive bra. Although not required, special nursing bras and tank tops are designed to allow access to your breasts for breastfeeding without taking off your entire bra or top. Avoid wearing underwire-style bras or extremely tight bras.  Air dry your nipples for 3-4minutes after each feeding.   Use only cotton bra pads to absorb leaked breast milk. Leaking of breast milk between feedings is normal.   Use lanolin on your nipples after breastfeeding. Lanolin helps to maintain your skin's   normal moisture barrier. If you use pure lanolin, you do not need to wash it off before feeding your baby again. Pure lanolin is not toxic to your baby. You may also hand express a few drops of breast milk and gently massage that milk into your nipples and allow the milk to air dry. In the first few weeks after giving birth, some women experience extremely full breasts (engorgement). Engorgement can make your breasts feel heavy, warm, and tender to the touch.  Engorgement peaks within 3-5 days after you give birth. The following recommendations can help ease engorgement:  Completely empty your breasts while breastfeeding or pumping. You may want to start by applying warm, moist heat (in the shower or with warm water-soaked hand towels) just before feeding or pumping. This increases circulation and helps the milk flow. If your baby does not completely empty your breasts while breastfeeding, pump any extra milk after he or she is finished.  Wear a snug bra (nursing or regular) or tank top for 1-2 days to signal your body to slightly decrease milk production.  Apply ice packs to your breasts, unless this is too uncomfortable for you.  Make sure that your baby is latched on and positioned properly while breastfeeding. If engorgement persists after 48 hours of following these recommendations, contact your health care provider or a lactation consultant. OVERALL HEALTH CARE RECOMMENDATIONS WHILE BREASTFEEDING  Eat healthy foods. Alternate between meals and snacks, eating 3 of each per day. Because what you eat affects your breast milk, some of the foods may make your baby more irritable than usual. Avoid eating these foods if you are sure that they are negatively affecting your baby.  Drink milk, fruit juice, and water to satisfy your thirst (about 10 glasses a day).   Rest often, relax, and continue to take your prenatal vitamins to prevent fatigue, stress, and anemia.  Continue breast self-awareness checks.  Avoid chewing and smoking tobacco.  Avoid alcohol and drug use. Some medicines that may be harmful to your baby can pass through breast milk. It is important to ask your health care provider before taking any medicine, including all over-the-counter and prescription medicine as well as vitamin and herbal supplements. It is possible to become pregnant while breastfeeding. If birth control is desired, ask your health care provider about options that  will be safe for your baby. SEEK MEDICAL CARE IF:   You feel like you want to stop breastfeeding or have become frustrated with breastfeeding.  You have painful breasts or nipples.  Your nipples are cracked or bleeding.  Your breasts are red, tender, or warm.  You have a swollen area on either breast.  You have a fever or chills.  You have nausea or vomiting.  You have drainage other than breast milk from your nipples.  Your breasts do not become full before feedings by the fifth day after you give birth.  You feel sad and depressed.  Your baby is too sleepy to eat well.  Your baby is having trouble sleeping.   Your baby is wetting less than 3 diapers in a 24-hour period.  Your baby has less than 3 stools in a 24-hour period.  Your baby's skin or the white part of his or her eyes becomes yellow.   Your baby is not gaining weight by 5 days of age. SEEK IMMEDIATE MEDICAL CARE IF:   Your baby is overly tired (lethargic) and does not want to wake up and feed.  Your baby   develops an unexplained fever. Document Released: 01/31/2005 Document Revised: 02/05/2013 Document Reviewed: 07/25/2012 [employer] Patient Information 2015 [employer]. This information is not intended to replace advice given to you by your health care provider. Make sure you discuss any questions you have with your health care provider.  

## 2013-09-12 NOTE — Progress Notes (Signed)
Doing well Discussed some discomforts of pregnancy--sleeping and back pain.

## 2013-09-26 ENCOUNTER — Ambulatory Visit (INDEPENDENT_AMBULATORY_CARE_PROVIDER_SITE_OTHER): Payer: No Typology Code available for payment source | Admitting: Obstetrics & Gynecology

## 2013-09-26 ENCOUNTER — Encounter: Payer: Self-pay | Admitting: Obstetrics & Gynecology

## 2013-09-26 VITALS — BP 133/83 | HR 110 | Wt 183.4 lb

## 2013-09-26 DIAGNOSIS — Z3403 Encounter for supervision of normal first pregnancy, third trimester: Secondary | ICD-10-CM

## 2013-09-26 DIAGNOSIS — Z3400 Encounter for supervision of normal first pregnancy, unspecified trimester: Secondary | ICD-10-CM

## 2013-09-26 NOTE — Progress Notes (Signed)
Routine visit. Good FM. No problems. Cervical cultures at next visit. 

## 2013-10-08 ENCOUNTER — Ambulatory Visit (INDEPENDENT_AMBULATORY_CARE_PROVIDER_SITE_OTHER): Payer: No Typology Code available for payment source | Admitting: Obstetrics & Gynecology

## 2013-10-08 ENCOUNTER — Encounter: Payer: Self-pay | Admitting: Obstetrics & Gynecology

## 2013-10-08 VITALS — BP 118/68 | HR 75 | Wt 186.2 lb

## 2013-10-08 DIAGNOSIS — Z113 Encounter for screening for infections with a predominantly sexual mode of transmission: Secondary | ICD-10-CM

## 2013-10-08 DIAGNOSIS — Z3685 Encounter for antenatal screening for Streptococcus B: Secondary | ICD-10-CM | POA: Diagnosis not present

## 2013-10-08 DIAGNOSIS — O368190 Decreased fetal movements, unspecified trimester, not applicable or unspecified: Secondary | ICD-10-CM | POA: Diagnosis not present

## 2013-10-08 DIAGNOSIS — Z3400 Encounter for supervision of normal first pregnancy, unspecified trimester: Secondary | ICD-10-CM

## 2013-10-08 DIAGNOSIS — Z3403 Encounter for supervision of normal first pregnancy, third trimester: Secondary | ICD-10-CM

## 2013-10-08 LAB — OB RESULTS CONSOLE GC/CHLAMYDIA
Chlamydia: NEGATIVE
GC PROBE AMP, GENITAL: NEGATIVE

## 2013-10-08 LAB — OB RESULTS CONSOLE GBS: GBS: NEGATIVE

## 2013-10-08 MED ORDER — BREAST PUMP MISC: Status: DC

## 2013-10-08 NOTE — Progress Notes (Signed)
Increase in swelling in feet/hands, "it feels like arthritis" .

## 2013-10-08 NOTE — Progress Notes (Signed)
Routine visit. Some decrease in FM but still moves reasonably, more at night. Cultures today. No VB, ROM, steady contractions. Labor precautions reviewed.NST reactive. Breast pump prescription given per her request.

## 2013-10-09 LAB — GC/CHLAMYDIA PROBE AMP
CT Probe RNA: NEGATIVE
GC PROBE AMP APTIMA: NEGATIVE

## 2013-10-11 LAB — CULTURE, BETA STREP (GROUP B ONLY)

## 2013-10-15 ENCOUNTER — Ambulatory Visit (INDEPENDENT_AMBULATORY_CARE_PROVIDER_SITE_OTHER): Payer: No Typology Code available for payment source | Admitting: Obstetrics & Gynecology

## 2013-10-15 ENCOUNTER — Encounter: Payer: Self-pay | Admitting: Obstetrics & Gynecology

## 2013-10-15 VITALS — BP 124/73 | HR 72 | Wt 186.8 lb

## 2013-10-15 DIAGNOSIS — Z3400 Encounter for supervision of normal first pregnancy, unspecified trimester: Secondary | ICD-10-CM

## 2013-10-15 DIAGNOSIS — Z3403 Encounter for supervision of normal first pregnancy, third trimester: Secondary | ICD-10-CM

## 2013-10-15 NOTE — Patient Instructions (Signed)
Return to clinic for any obstetric concerns or go to [facility] for evaluation  

## 2013-10-15 NOTE — Progress Notes (Signed)
Increased discharge with some pink tinge; wondering about possible cervical change.  GBS, GC/Chlam all negative. No cervical change from last week. Labor and fetal movement precautions reviewed.

## 2013-10-22 ENCOUNTER — Ambulatory Visit (INDEPENDENT_AMBULATORY_CARE_PROVIDER_SITE_OTHER): Payer: No Typology Code available for payment source | Admitting: Obstetrics & Gynecology

## 2013-10-22 VITALS — BP 123/76 | HR 82 | Wt 186.0 lb

## 2013-10-22 DIAGNOSIS — Z3403 Encounter for supervision of normal first pregnancy, third trimester: Secondary | ICD-10-CM

## 2013-10-22 DIAGNOSIS — Z3400 Encounter for supervision of normal first pregnancy, unspecified trimester: Secondary | ICD-10-CM

## 2013-10-22 NOTE — Patient Instructions (Signed)
Braxton Hicks Contractions °Contractions of the uterus can occur throughout pregnancy. Contractions are not always a sign that you are in labor.  °WHAT ARE BRAXTON HICKS CONTRACTIONS?  °Contractions that occur before labor are called Braxton Hicks contractions, or false labor. Toward the end of pregnancy (32-34 weeks), these contractions can develop more often and may become more forceful. This is not true labor because these contractions do not result in opening (dilatation) and thinning of the cervix. They are sometimes difficult to tell apart from true labor because these contractions can be forceful and people have different pain tolerances. You should not feel embarrassed if you go to the hospital with false labor. Sometimes, the only way to tell if you are in true labor is for your health care provider to look for changes in the cervix. °If there are no prenatal problems or other health problems associated with the pregnancy, it is completely safe to be sent home with false labor and await the onset of true labor. °HOW CAN YOU TELL THE DIFFERENCE BETWEEN TRUE AND FALSE LABOR? °False Labor °· The contractions of false labor are usually shorter and not as hard as those of true labor.   °· The contractions are usually irregular.   °· The contractions are often felt in the front of the lower abdomen and in the groin.   °· The contractions may go away when you walk around or change positions while lying down.   °· The contractions get weaker and are shorter lasting as time goes on.   °· The contractions do not usually become progressively stronger, regular, and closer together as with true labor.   °True Labor °· Contractions in true labor last 30-70 seconds, become very regular, usually become more intense, and increase in frequency.   °· The contractions do not go away with walking.   °· The discomfort is usually felt in the top of the uterus and spreads to the lower abdomen and low back.   °· True labor can be  determined by your health care provider with an exam. This will show that the cervix is dilating and getting thinner.   °WHAT TO REMEMBER °· Keep up with your usual exercises and follow other instructions given by your health care provider.   °· Take medicines as directed by your health care provider.   °· Keep your regular prenatal appointments.   °· Eat and drink lightly if you think you are going into labor.   °· If Braxton Hicks contractions are making you uncomfortable:   °¨ Change your position from lying down or resting to walking, or from walking to resting.   °¨ Sit and rest in a tub of warm water.   °¨ Drink 2-3 glasses of water. Dehydration may cause these contractions.   °¨ Do slow and deep breathing several times an hour.   °WHEN SHOULD I SEEK IMMEDIATE MEDICAL CARE? °Seek immediate medical care if: °· Your contractions become stronger, more regular, and closer together.   °· You have fluid leaking or gushing from your vagina.   °· You have a fever.   °· You pass blood-tinged mucus.   °· You have vaginal bleeding.   °· You have continuous abdominal pain.   °· You have low back pain that you never had before.   °· You feel your baby's head pushing down and causing pelvic pressure.   °· Your baby is not moving as much as it used to.   °Document Released: 01/31/2005 Document Revised: 02/05/2013 Document Reviewed: 11/12/2012 °[employer]® Patient Information ©2015 [employer]. This information is not intended to replace advice given to you by your health care   provider. Make sure you discuss any questions you have with your health care provider. ° °

## 2013-10-22 NOTE — Progress Notes (Signed)
No complaints or concerns.  Labor and fetal movement precautions reviewed. 

## 2013-10-29 ENCOUNTER — Ambulatory Visit (INDEPENDENT_AMBULATORY_CARE_PROVIDER_SITE_OTHER): Payer: No Typology Code available for payment source | Admitting: Family Medicine

## 2013-10-29 VITALS — BP 104/68 | HR 75 | Wt 188.0 lb

## 2013-10-29 DIAGNOSIS — Z3403 Encounter for supervision of normal first pregnancy, third trimester: Secondary | ICD-10-CM

## 2013-10-29 DIAGNOSIS — Z3400 Encounter for supervision of normal first pregnancy, unspecified trimester: Secondary | ICD-10-CM

## 2013-10-29 NOTE — Patient Instructions (Addendum)
Third Trimester of Pregnancy The third trimester is from week 29 through week 42, months 7 through 9. The third trimester is a time when the fetus is growing rapidly. At the end of the ninth month, the fetus is about 20 inches in length and weighs 6-10 pounds.  BODY CHANGES Your body goes through many changes during pregnancy. The changes vary from woman to woman.   Your weight will continue to increase. You can expect to gain 25-35 pounds (11-16 kg) by the end of the pregnancy.  You may begin to get stretch marks on your hips, abdomen, and breasts.  You may urinate more often because the fetus is moving lower into your pelvis and pressing on your bladder.  You may develop or continue to have heartburn as a result of your pregnancy.  You may develop constipation because certain hormones are causing the muscles that push waste through your intestines to slow down.  You may develop hemorrhoids or swollen, bulging veins (varicose veins).  You may have pelvic pain because of the weight gain and pregnancy hormones relaxing your joints between the bones in your pelvis. Backaches may result from overexertion of the muscles supporting your posture.  You may have changes in your hair. These can include thickening of your hair, rapid growth, and changes in texture. Some women also have hair loss during or after pregnancy, or hair that feels dry or thin. Your hair will most likely return to normal after your baby is born.  Your breasts will continue to grow and be tender. A yellow discharge may leak from your breasts called colostrum.  Your belly button may stick out.  You may feel short of breath because of your expanding uterus.  You may notice the fetus "dropping," or moving lower in your abdomen.  You may have a bloody mucus discharge. This usually occurs a few days to a week before labor begins.  Your cervix becomes thin and soft (effaced) near your due date. WHAT TO EXPECT AT YOUR  PRENATAL EXAMS  You will have prenatal exams every 2 weeks until week 36. Then, you will have weekly prenatal exams. During a routine prenatal visit:  You will be weighed to make sure you and the fetus are growing normally.  Your blood pressure is taken.  Your abdomen will be measured to track your baby's growth.  The fetal heartbeat will be listened to.  Any test results from the previous visit will be discussed.  You may have a cervical check near your due date to see if you have effaced. At around 36 weeks, your caregiver will check your cervix. At the same time, your caregiver will also perform a test on the secretions of the vaginal tissue. This test is to determine if a type of bacteria, Group B streptococcus, is present. Your caregiver will explain this further. Your caregiver may ask you:  What your birth plan is.  How you are feeling.  If you are feeling the baby move.  If you have had any abnormal symptoms, such as leaking fluid, bleeding, severe headaches, or abdominal cramping.  If you have any questions. Other tests or screenings that may be performed during your third trimester include:  Blood tests that check for low iron levels (anemia).  Fetal testing to check the health, activity level, and growth of the fetus. Testing is done if you have certain medical conditions or if there are problems during the pregnancy. FALSE LABOR You may feel small, irregular contractions that   eventually go away. These are called Braxton Hicks contractions, or false labor. Contractions may last for hours, days, or even weeks before true labor sets in. If contractions come at regular intervals, intensify, or become painful, it is best to be seen by your caregiver.  SIGNS OF LABOR   Menstrual-like cramps.  Contractions that are 5 minutes apart or less.  Contractions that start on the top of the uterus and spread down to the lower abdomen and back.  A sense of increased pelvic  pressure or back pain.  A watery or bloody mucus discharge that comes from the vagina. If you have any of these signs before the 37th week of pregnancy, call your caregiver right away. You need to go to the hospital to get checked immediately. HOME CARE INSTRUCTIONS   Avoid all smoking, herbs, alcohol, and unprescribed drugs. These chemicals affect the formation and growth of the baby.  Follow your caregiver's instructions regarding medicine use. There are medicines that are either safe or unsafe to take during pregnancy.  Exercise only as directed by your caregiver. Experiencing uterine cramps is a good sign to stop exercising.  Continue to eat regular, healthy meals.  Wear a good support bra for breast tenderness.  Do not use hot tubs, steam rooms, or saunas.  Wear your seat belt at all times when driving.  Avoid raw meat, uncooked cheese, cat litter boxes, and soil used by cats. These carry germs that can cause birth defects in the baby.  Take your prenatal vitamins.  Try taking a stool softener (if your caregiver approves) if you develop constipation. Eat more high-fiber foods, such as fresh vegetables or fruit and whole grains. Drink plenty of fluids to keep your urine clear or pale yellow.  Take warm sitz baths to soothe any pain or discomfort caused by hemorrhoids. Use hemorrhoid cream if your caregiver approves.  If you develop varicose veins, wear support hose. Elevate your feet for 15 minutes, 3-4 times a day. Limit salt in your diet.  Avoid heavy lifting, wear low heal shoes, and practice good posture.  Rest a lot with your legs elevated if you have leg cramps or low back pain.  Visit your dentist if you have not gone during your pregnancy. Use a soft toothbrush to brush your teeth and be gentle when you floss.  A sexual relationship may be continued unless your caregiver directs you otherwise.  Do not travel far distances unless it is absolutely necessary and only  with the approval of your caregiver.  Take prenatal classes to understand, practice, and ask questions about the labor and delivery.  Make a trial run to the hospital.  Pack your hospital bag.  Prepare the baby's nursery.  Continue to go to all your prenatal visits as directed by your caregiver. SEEK MEDICAL CARE IF:  You are unsure if you are in labor or if your water has broken.  You have dizziness.  You have mild pelvic cramps, pelvic pressure, or nagging pain in your abdominal area.  You have persistent nausea, vomiting, or diarrhea.  You have a bad smelling vaginal discharge.  You have pain with urination. SEEK IMMEDIATE MEDICAL CARE IF:   You have a fever.  You are leaking fluid from your vagina.  You have spotting or bleeding from your vagina.  You have severe abdominal cramping or pain.  You have rapid weight loss or gain.  You have shortness of breath with chest pain.  You notice sudden or extreme swelling   of your face, hands, ankles, feet, or legs.  You have not felt your baby move in over an hour.  You have severe headaches that do not go away with medicine.  You have vision changes. Document Released: 01/25/2001 Document Revised: 02/05/2013 Document Reviewed: 04/03/2012 [employer] Patient Information 2015 [employer]. This information is not intended to replace advice given to you by your health care provider. Make sure you discuss any questions you have with your health care provider.  Breastfeeding Deciding to breastfeed is one of the best choices you can make for you and your baby. A change in hormones during pregnancy causes your breast tissue to grow and increases the number and size of your milk ducts. These hormones also allow proteins, sugars, and fats from your blood supply to make breast milk in your milk-producing glands. Hormones prevent breast milk from being released before your baby is born as well as prompt milk flow after birth. Once  breastfeeding has begun, thoughts of your baby, as well as his or her sucking or crying, can stimulate the release of milk from your milk-producing glands.  BENEFITS OF BREASTFEEDING For Your Baby  Your first milk (colostrum) helps your baby's digestive system function better.   There are antibodies in your milk that help your baby fight off infections.   Your baby has a lower incidence of asthma, allergies, and sudden infant death syndrome.   The nutrients in breast milk are better for your baby than infant formulas and are designed uniquely for your baby's needs.   Breast milk improves your baby's brain development.   Your baby is less likely to develop other conditions, such as childhood obesity, asthma, or type 2 diabetes mellitus.  For You   Breastfeeding helps to create a very special bond between you and your baby.   Breastfeeding is convenient. Breast milk is always available at the correct temperature and costs nothing.   Breastfeeding helps to burn calories and helps you lose the weight gained during pregnancy.   Breastfeeding makes your uterus contract to its prepregnancy size faster and slows bleeding (lochia) after you give birth.   Breastfeeding helps to lower your risk of developing type 2 diabetes mellitus, osteoporosis, and breast or ovarian cancer later in life. SIGNS THAT YOUR BABY IS HUNGRY Early Signs of Hunger  Increased alertness or activity.  Stretching.  Movement of the head from side to side.  Movement of the head and opening of the mouth when the corner of the mouth or cheek is stroked (rooting).  Increased sucking sounds, smacking lips, cooing, sighing, or squeaking.  Hand-to-mouth movements.  Increased sucking of fingers or hands. Late Signs of Hunger  Fussing.  Intermittent crying. Extreme Signs of Hunger Signs of extreme hunger will require calming and consoling before your baby will be able to breastfeed successfully. Do not  wait for the following signs of extreme hunger to occur before you initiate breastfeeding:   Restlessness.  A loud, strong cry.   Screaming. BREASTFEEDING BASICS Breastfeeding Initiation  Find a comfortable place to sit or lie down, with your neck and back well supported.  Place a pillow or rolled up blanket under your baby to bring him or her to the level of your breast (if you are seated). Nursing pillows are specially designed to help support your arms and your baby while you breastfeed.  Make sure that your baby's abdomen is facing your abdomen.   Gently massage your breast. With your fingertips, massage from your chest   wall toward your nipple in a circular motion. This encourages milk flow. You may need to continue this action during the feeding if your milk flows slowly.  Support your breast with 4 fingers underneath and your thumb above your nipple. Make sure your fingers are well away from your nipple and your baby's mouth.   Stroke your baby's lips gently with your finger or nipple.   When your baby's mouth is open wide enough, quickly bring your baby to your breast, placing your entire nipple and as much of the colored area around your nipple (areola) as possible into your baby's mouth.   More areola should be visible above your baby's upper lip than below the lower lip.   Your baby's tongue should be between his or her lower gum and your breast.   Ensure that your baby's mouth is correctly positioned around your nipple (latched). Your baby's lips should create a seal on your breast and be turned out (everted).  It is common for your baby to suck about 2-3 minutes in order to start the flow of breast milk. Latching Teaching your baby how to latch on to your breast properly is very important. An improper latch can cause nipple pain and decreased milk supply for you and poor weight gain in your baby. Also, if your baby is not latched onto your nipple properly, he or she  may swallow some air during feeding. This can make your baby fussy. Burping your baby when you switch breasts during the feeding can help to get rid of the air. However, teaching your baby to latch on properly is still the best way to prevent fussiness from swallowing air while breastfeeding. Signs that your baby has successfully latched on to your nipple:    Silent tugging or silent sucking, without causing you pain.   Swallowing heard between every 3-4 sucks.    Muscle movement above and in front of his or her ears while sucking.  Signs that your baby has not successfully latched on to nipple:   Sucking sounds or smacking sounds from your baby while breastfeeding.  Nipple pain. If you think your baby has not latched on correctly, slip your finger into the corner of your baby's mouth to break the suction and place it between your baby's gums. Attempt breastfeeding initiation again. Signs of Successful Breastfeeding Signs from your baby:   A gradual decrease in the number of sucks or complete cessation of sucking.   Falling asleep.   Relaxation of his or her body.   Retention of a small amount of milk in his or her mouth.   Letting go of your breast by himself or herself. Signs from you:  Breasts that have increased in firmness, weight, and size 1-3 hours after feeding.   Breasts that are softer immediately after breastfeeding.  Increased milk volume, as well as a change in milk consistency and color by the fifth day of breastfeeding.   Nipples that are not sore, cracked, or bleeding. Signs That Your Baby is Getting Enough Milk  Wetting at least 3 diapers in a 24-hour period. The urine should be clear and pale yellow by age 5 days.  At least 3 stools in a 24-hour period by age 5 days. The stool should be soft and yellow.  At least 3 stools in a 24-hour period by age 7 days. The stool should be seedy and yellow.  No loss of weight greater than 10% of birth weight  during the first 3   days of age.  Average weight gain of 4-7 ounces (113-198 g) per week after age 4 days.  Consistent daily weight gain by age 5 days, without weight loss after the age of 2 weeks. After a feeding, your baby may spit up a small amount. This is common. BREASTFEEDING FREQUENCY AND DURATION Frequent feeding will help you make more milk and can prevent sore nipples and breast engorgement. Breastfeed when you feel the need to reduce the fullness of your breasts or when your baby shows signs of hunger. This is called "breastfeeding on demand." Avoid introducing a pacifier to your baby while you are working to establish breastfeeding (the first 4-6 weeks after your baby is born). After this time you may choose to use a pacifier. Research has shown that pacifier use during the first year of a baby's life decreases the risk of sudden infant death syndrome (SIDS). Allow your baby to feed on each breast as long as he or she wants. Breastfeed until your baby is finished feeding. When your baby unlatches or falls asleep while feeding from the first breast, offer the second breast. Because newborns are often sleepy in the first few weeks of life, you may need to awaken your baby to get him or her to feed. Breastfeeding times will vary from baby to baby. However, the following rules can serve as a guide to help you ensure that your baby is properly fed:  Newborns (babies 4 weeks of age or younger) may breastfeed every 1-3 hours.  Newborns should not go longer than 3 hours during the day or 5 hours during the night without breastfeeding.  You should breastfeed your baby a minimum of 8 times in a 24-hour period until you begin to introduce solid foods to your baby at around 6 months of age. BREAST MILK PUMPING Pumping and storing breast milk allows you to ensure that your baby is exclusively fed your breast milk, even at times when you are unable to breastfeed. This is especially important if you are  going back to work while you are still breastfeeding or when you are not able to be present during feedings. Your lactation consultant can give you guidelines on how long it is safe to store breast milk.  A breast pump is a machine that allows you to pump milk from your breast into a sterile bottle. The pumped breast milk can then be stored in a refrigerator or freezer. Some breast pumps are operated by hand, while others use electricity. Ask your lactation consultant which type will work best for you. Breast pumps can be purchased, but some hospitals and breastfeeding support groups lease breast pumps on a monthly basis. A lactation consultant can teach you how to hand express breast milk, if you prefer not to use a pump.  CARING FOR YOUR BREASTS WHILE YOU BREASTFEED Nipples can become dry, cracked, and sore while breastfeeding. The following recommendations can help keep your breasts moisturized and healthy:  Avoid using soap on your nipples.   Wear a supportive bra. Although not required, special nursing bras and tank tops are designed to allow access to your breasts for breastfeeding without taking off your entire bra or top. Avoid wearing underwire-style bras or extremely tight bras.  Air dry your nipples for 3-4minutes after each feeding.   Use only cotton bra pads to absorb leaked breast milk. Leaking of breast milk between feedings is normal.   Use lanolin on your nipples after breastfeeding. Lanolin helps to maintain your skin's   normal moisture barrier. If you use pure lanolin, you do not need to wash it off before feeding your baby again. Pure lanolin is not toxic to your baby. You may also hand express a few drops of breast milk and gently massage that milk into your nipples and allow the milk to air dry. In the first few weeks after giving birth, some women experience extremely full breasts (engorgement). Engorgement can make your breasts feel heavy, warm, and tender to the touch.  Engorgement peaks within 3-5 days after you give birth. The following recommendations can help ease engorgement:  Completely empty your breasts while breastfeeding or pumping. You may want to start by applying warm, moist heat (in the shower or with warm water-soaked hand towels) just before feeding or pumping. This increases circulation and helps the milk flow. If your baby does not completely empty your breasts while breastfeeding, pump any extra milk after he or she is finished.  Wear a snug bra (nursing or regular) or tank top for 1-2 days to signal your body to slightly decrease milk production.  Apply ice packs to your breasts, unless this is too uncomfortable for you.  Make sure that your baby is latched on and positioned properly while breastfeeding. If engorgement persists after 48 hours of following these recommendations, contact your health care provider or a lactation consultant. OVERALL HEALTH CARE RECOMMENDATIONS WHILE BREASTFEEDING  Eat healthy foods. Alternate between meals and snacks, eating 3 of each per day. Because what you eat affects your breast milk, some of the foods may make your baby more irritable than usual. Avoid eating these foods if you are sure that they are negatively affecting your baby.  Drink milk, fruit juice, and water to satisfy your thirst (about 10 glasses a day).   Rest often, relax, and continue to take your prenatal vitamins to prevent fatigue, stress, and anemia.  Continue breast self-awareness checks.  Avoid chewing and smoking tobacco.  Avoid alcohol and drug use. Some medicines that may be harmful to your baby can pass through breast milk. It is important to ask your health care provider before taking any medicine, including all over-the-counter and prescription medicine as well as vitamin and herbal supplements. It is possible to become pregnant while breastfeeding. If birth control is desired, ask your health care provider about options that  will be safe for your baby. SEEK MEDICAL CARE IF:   You feel like you want to stop breastfeeding or have become frustrated with breastfeeding.  You have painful breasts or nipples.  Your nipples are cracked or bleeding.  Your breasts are red, tender, or warm.  You have a swollen area on either breast.  You have a fever or chills.  You have nausea or vomiting.  You have drainage other than breast milk from your nipples.  Your breasts do not become full before feedings by the fifth day after you give birth.  You feel sad and depressed.  Your baby is too sleepy to eat well.  Your baby is having trouble sleeping.   Your baby is wetting less than 3 diapers in a 24-hour period.  Your baby has less than 3 stools in a 24-hour period.  Your baby's skin or the white part of his or her eyes becomes yellow.   Your baby is not gaining weight by 5 days of age. SEEK IMMEDIATE MEDICAL CARE IF:   Your baby is overly tired (lethargic) and does not want to wake up and feed.  Your baby   develops an unexplained fever. Document Released: 01/31/2005 Document Revised: 02/05/2013 Document Reviewed: 07/25/2012 [facility] Patient Information 2015 [place], [place]. This information is not intended to replace advice given to you by your health care provider. Make sure you discuss any questions you have with your health care provider. Vaginal Delivery During delivery, your health care provider will help you give birth to your baby. During a vaginal delivery, you will work to push the baby out of your vagina. However, before you can push your baby out, a few things need to happen. The opening of your uterus (cervix) has to soften, thin out, and open up (dilate) all the way to 10 cm. Also, your baby has to move down from the uterus into your vagina.  SIGNS OF LABOR  Your health care provider will first need to make sure you are in labor. Signs of labor include:   Passing what is called the mucous plug  before labor begins. This is a small amount of blood-stained mucus.  Having regular, painful uterine contractions.   The time between contractions gets shorter.   The discomfort and pain gradually get more intense.  Contraction pains get worse when walking and do not go away when resting.   Your cervix becomes thinner (effacement) and dilates. BEFORE THE DELIVERY Once you are in labor and admitted into the hospital or care center, your health care provider may do the following:   Perform a complete physical exam.  Review any complications related to pregnancy or labor.  Check your blood pressure, pulse, temperature, and heart rate (vital signs).   Determine if, and when, the rupture of amniotic membranes occurred.  Do a vaginal exam (using a sterile glove and lubricant) to determine:   The position (presentation) of the baby. Is the baby's head presenting first (vertex) in the birth canal (vagina), or are the feet or buttocks first (breech)?   The level (station) of the baby's head within the birth canal.   The effacement and dilatation of the cervix.   An electronic fetal monitor is usually placed on your abdomen when you first arrive. This is used to monitor your contractions and the baby's heart rate.  When the monitor is on your abdomen (external fetal monitor), it can only pick up the frequency and length of your contractions. It cannot tell the strength of your contractions.  If it becomes necessary for your health care provider to know exactly how strong your contractions are or to see exactly what the baby's heart rate is doing, an internal monitor may be inserted into your vagina and uterus. Your health care provider will discuss the benefits and risks of using an internal monitor and obtain your permission before inserting the device.  Continuous fetal monitoring may be needed if you have an epidural, are receiving certain medicines (such as oxytocin), or have  pregnancy or labor complications.  An IV access tube may be placed into a vein in your arm to deliver fluids and medicines if necessary. THREE STAGES OF LABOR AND DELIVERY Normal labor and delivery is divided into three stages. First Stage This stage starts when you begin to contract regularly and your cervix begins to efface and dilate. It ends when your cervix is completely open (fully dilated). The first stage is the longest stage of labor and can last from 3 hours to 15 hours.  Several methods are available to help with labor pain. You and your health care provider will decide which option is best for you.  Options include:   Opioid medicines. These are strong pain medicines that you can get through your IV tube or as a shot into your muscle. These medicines lessen pain but do not make it go away completely.  Epidural. A medicine is given through a thin tube that is inserted in your back. The medicine numbs the lower part of your body and prevents any pain in that area.  Paracervical pain medicine. This is an injection of an anesthetic on each side of your cervix.   You may request natural childbirth, which does not involve the use of pain medicines or an epidural during labor and delivery. Instead, you will use other things, such as breathing exercises, to help cope with the pain. Second Stage The second stage of labor begins when your cervix is fully dilated at 10 cm. It continues until you push your baby down through the birth canal and the baby is born. This stage can take only minutes or several hours.  The location of your baby's head as it moves through the birth canal is reported as a number called a station. If the baby's head has not started its descent, the station is described as being at minus 3 (-3). When your baby's head is at the zero station, it is at the middle of the birth canal and is engaged in the pelvis. The station of your baby helps indicate the progress of the second  stage of labor.  When your baby is born, your health care provider may hold the baby with his or her head lowered to prevent amniotic fluid, mucus, and blood from getting into the baby's lungs. The baby's mouth and nose may be suctioned with a small bulb syringe to remove any additional fluid.  Your health care provider may then place the baby on your stomach. It is important to keep the baby from getting cold. To do this, the health care provider will dry the baby off, place the baby directly on your skin (with no blankets between you and the baby), and cover the baby with warm, dry blankets.   The umbilical cord is cut. Third Stage During the third stage of labor, your health care provider will deliver the placenta (afterbirth) and make sure your bleeding is under control. The delivery of the placenta usually takes about 5 minutes but can take up to 30 minutes. After the placenta is delivered, a medicine may be given either by IV or injection to help contract the uterus and control bleeding. If you are planning to breastfeed, you can try to do so now. After you deliver the placenta, your uterus should contract and get very firm. If your uterus does not remain firm, your health care provider will massage it. This is important because the contraction of the uterus helps cut off bleeding at the site where the placenta was attached to your uterus. If your uterus does not contract properly and stay firm, you may continue to bleed heavily. If there is a lot of bleeding, medicines may be given to contract the uterus and stop the bleeding.  Document Released: 11/10/2007 Document Revised: 06/17/2013 Document Reviewed: 07/22/2012 [facility] Patient Information 2015 [place], [place]. This information is not intended to replace advice given to you by your health care provider. Make sure you discuss any questions you have with your health care provider.

## 2013-10-29 NOTE — Progress Notes (Signed)
Having some contractions in back No LOF Reports excellent fetal movement Membranes stripped

## 2013-11-02 ENCOUNTER — Inpatient Hospital Stay (HOSPITAL_COMMUNITY)
Admission: AD | Admit: 2013-11-02 | Discharge: 2013-11-05 | DRG: 775 | Disposition: A | Discharge status: Home / Self Care | Payer: No Typology Code available for payment source | Source: Ambulatory Visit | Attending: Obstetrics & Gynecology | Admitting: Obstetrics & Gynecology

## 2013-11-02 ENCOUNTER — Encounter (HOSPITAL_COMMUNITY): Payer: Self-pay | Admitting: *Deleted

## 2013-11-02 DIAGNOSIS — O4290 Premature rupture of membranes, unspecified as to length of time between rupture and onset of labor, unspecified weeks of gestation: Secondary | ICD-10-CM | POA: Diagnosis present

## 2013-11-02 DIAGNOSIS — Z3490 Encounter for supervision of normal pregnancy, unspecified, unspecified trimester: Secondary | ICD-10-CM

## 2013-11-02 DIAGNOSIS — O479 False labor, unspecified: Secondary | ICD-10-CM | POA: Diagnosis present

## 2013-11-02 LAB — URINALYSIS, ROUTINE W REFLEX MICROSCOPIC
BILIRUBIN URINE: NEGATIVE
Glucose, UA: NEGATIVE mg/dL
Hgb urine dipstick: NEGATIVE
Ketones, ur: NEGATIVE mg/dL
Leukocytes, UA: NEGATIVE
NITRITE: NEGATIVE
Protein, ur: NEGATIVE mg/dL
Specific Gravity, Urine: <1.005 — ABNORMAL LOW (ref 1.005–1.030)
UROBILINOGEN UA: 0.2 mg/dL (ref 0.0–1.0)
pH: 6 (ref 5.0–8.0)

## 2013-11-02 LAB — CBC
HEMATOCRIT: 32.7 % — AB (ref 36.0–46.0)
Hemoglobin: 11.1 g/dL — ABNORMAL LOW (ref 12.0–15.0)
MCH: 29.2 pg (ref 26.0–34.0)
MCHC: 33.9 g/dL (ref 30.0–36.0)
MCV: 86.1 fL (ref 78.0–100.0)
Platelets: 214 10*3/uL (ref 150–400)
RBC: 3.8 MIL/uL — AB (ref 3.87–5.11)
RDW: 13.9 % (ref 11.5–15.5)
WBC: 12.7 10*3/uL — ABNORMAL HIGH (ref 4.0–10.5)

## 2013-11-02 LAB — COMPREHENSIVE METABOLIC PANEL
ALT: 15 U/L (ref 0–35)
ANION GAP: 14 (ref 5–15)
AST: 16 U/L (ref 0–37)
Albumin: 2.5 g/dL — ABNORMAL LOW (ref 3.5–5.2)
Alkaline Phosphatase: 155 U/L — ABNORMAL HIGH (ref 39–117)
BUN: 6 mg/dL (ref 6–23)
CO2: 22 meq/L (ref 19–32)
CREATININE: 0.64 mg/dL (ref 0.50–1.10)
Calcium: 8.9 mg/dL (ref 8.4–10.5)
Chloride: 103 mEq/L (ref 96–112)
Glucose, Bld: 127 mg/dL — ABNORMAL HIGH (ref 70–99)
Potassium: 3.6 mEq/L — ABNORMAL LOW (ref 3.7–5.3)
Sodium: 139 mEq/L (ref 137–147)
Total Protein: 6 g/dL (ref 6.0–8.3)

## 2013-11-02 LAB — PROTEIN / CREATININE RATIO, URINE
Creatinine, Urine: 37.31 mg/dL
PROTEIN CREATININE RATIO: 0.11 (ref 0.00–0.15)
Total Protein, Urine: 4 mg/dL

## 2013-11-02 LAB — RPR

## 2013-11-02 MED ORDER — OXYCODONE-ACETAMINOPHEN 5-325 MG PO TABS: 2.0000 | ORAL_TABLET | ORAL | Status: DC | PRN

## 2013-11-02 MED ORDER — LIDOCAINE HCL (PF) 1 % IJ SOLN
30.0000 mL | INTRAMUSCULAR | Status: DC | PRN
  Filled 2013-11-02: qty 30

## 2013-11-02 MED ORDER — OXYTOCIN 40 UNITS IN LACTATED RINGERS INFUSION - SIMPLE MED
62.5000 mL/h | INTRAVENOUS | Status: DC
  Administered 2013-11-03: 62.5 mL/h via INTRAVENOUS

## 2013-11-02 MED ORDER — PHENYLEPHRINE 40 MCG/ML (10ML) SYRINGE FOR IV PUSH (FOR BLOOD PRESSURE SUPPORT)
80.0000 ug | PREFILLED_SYRINGE | INTRAVENOUS | Status: DC | PRN
  Administered 2013-11-03 (×2): 80 ug via INTRAVENOUS
  Filled 2013-11-02: qty 2

## 2013-11-02 MED ORDER — FENTANYL 2.5 MCG/ML BUPIVACAINE 1/10 % EPIDURAL INFUSION (WH - ANES)
14.0000 mL/h | INTRAMUSCULAR | Status: DC | PRN
  Administered 2013-11-03 (×2): 14 mL/h via EPIDURAL
  Filled 2013-11-02 (×2): qty 125

## 2013-11-02 MED ORDER — FLEET ENEMA 7-19 GM/118ML RE ENEM: 1.0000 | ENEMA | RECTAL | Status: DC | PRN

## 2013-11-02 MED ORDER — LACTATED RINGERS IV SOLN
500.0000 mL | Freq: Once | INTRAVENOUS | Status: AC
  Administered 2013-11-03: 500 mL via INTRAVENOUS

## 2013-11-02 MED ORDER — CITRIC ACID-SODIUM CITRATE 334-500 MG/5ML PO SOLN: 30.0000 mL | ORAL | Status: DC | PRN

## 2013-11-02 MED ORDER — DIPHENHYDRAMINE HCL 50 MG/ML IJ SOLN: 12.5000 mg | INTRAMUSCULAR | Status: DC | PRN

## 2013-11-02 MED ORDER — LACTATED RINGERS IV SOLN
500.0000 mL | INTRAVENOUS | Status: DC | PRN
  Administered 2013-11-03: 1000 mL via INTRAVENOUS

## 2013-11-02 MED ORDER — ONDANSETRON HCL 4 MG/2ML IJ SOLN: 4.0000 mg | Freq: Four times a day (QID) | INTRAMUSCULAR | Status: DC | PRN

## 2013-11-02 MED ORDER — EPHEDRINE 5 MG/ML INJ
10.0000 mg | INTRAVENOUS | Status: DC | PRN
  Filled 2013-11-02: qty 2

## 2013-11-02 MED ORDER — OXYCODONE-ACETAMINOPHEN 5-325 MG PO TABS: 1.0000 | ORAL_TABLET | ORAL | Status: DC | PRN

## 2013-11-02 MED ORDER — TERBUTALINE SULFATE 1 MG/ML IJ SOLN
0.2500 mg | Freq: Once | INTRAMUSCULAR | Status: AC | PRN
Start: 2013-11-02 — End: 2013-11-02

## 2013-11-02 MED ORDER — OXYTOCIN 40 UNITS IN LACTATED RINGERS INFUSION - SIMPLE MED
1.0000 m[IU]/min | INTRAVENOUS | Status: DC
  Administered 2013-11-02: 2 m[IU]/min via INTRAVENOUS
  Filled 2013-11-02: qty 1000

## 2013-11-02 MED ORDER — PHENYLEPHRINE 40 MCG/ML (10ML) SYRINGE FOR IV PUSH (FOR BLOOD PRESSURE SUPPORT)
80.0000 ug | PREFILLED_SYRINGE | INTRAVENOUS | Status: DC | PRN
  Filled 2013-11-02: qty 10
  Filled 2013-11-02: qty 2

## 2013-11-02 MED ORDER — OXYTOCIN BOLUS FROM INFUSION
500.0000 mL | INTRAVENOUS | Status: DC
Start: 2013-11-02 — End: 2013-11-03

## 2013-11-02 MED ORDER — ACETAMINOPHEN 325 MG PO TABS
650.0000 mg | ORAL_TABLET | ORAL | Status: DC | PRN
Start: 2013-11-02 — End: 2013-11-03

## 2013-11-02 MED ORDER — LACTATED RINGERS IV SOLN
INTRAVENOUS | Status: DC
  Administered 2013-11-02 – 2013-11-03 (×3): via INTRAVENOUS

## 2013-11-02 NOTE — MAU Note (Signed)
Pt presents to MAU with complaints of rupture of membranes at 8 am. Denies any vaginal bleeding or contractions. Reports baby is active

## 2013-11-02 NOTE — H&P (Signed)
Anne Phelps is a 28 y.o. female G1P0 with IUP at [redacted]w[redacted]d presenting for LOF.  She started intermittently having small amounts of clear fluid around 8:30am. She was unsure whether or not she was urinating on herself or whether she had ROM. When she got to the MAU around 1:30pm she had a large gush of clear fluid that soaked her jeans. No vaginal bleeding or contractions. +fetal movement  BP elevated in MAU: No headaches vision change, scotomata, RUQ pain, change in urination. Some feet/hand swelling b/l but no swelling in the face.  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  B negative/antibody neg: Patient received Rhogam at 28 weeks on 08/14/13  Past Medical History: Past Medical History  Diagnosis Date  . Autoimmune disorder     Past Surgical History: History reviewed. No pertinent past surgical history.  Obstetrical History: OB History   Grav Para Term Preterm Abortions TAB SAB Ect Mult Living   1               Social History: History   Social History  . Marital Status: Single    Spouse Name: N/A    Number of Children: N/A  . Years of Education: N/A   Social History Main Topics  . Smoking status: Never Smoker   . Smokeless tobacco: Never Used  . Alcohol Use: No  . Drug Use: No  . Sexual Activity: Yes   Other Topics Concern  . None   Social History Narrative  . None    Family History: History reviewed. No pertinent family history.  Allergies: Allergies  Allergen Reactions  . Penicillins Swelling and Rash    Throat swells    Prescriptions prior to admission  Medication Sig Dispense Refill  . Prenatal Vit-Fe Fumarate-FA (PRENATAL MULTIVITAMIN) TABS tablet Take 1 tablet by mouth daily at 12 noon.      . Misc. Devices (BREAST PUMP) MISC Dispense one breast pump for patient  1 each  0     Review of  Systems   Constitutional: No fever, chills, fatigue  Blood pressure 143/82, pulse 103, last menstrual period 01/07/2013. General appearance: alert, cooperative and no distress Lungs: clear to auscultation bilaterally Heart: regular rate and rhythm Abdomen: soft, non-tender; bowel sounds normal Extremities: Homans sign is negative, no sign of DVT DTR's Brisk in the UE and LE b/l Presentation: unsure Fetal monitoringBaseline: 150 bpm, Variability: Good {> 6 bpm), Accelerations: Reactive and Decelerations: Absent Uterine activity Occasional contraction  Dilation: 1.5 Effacement (%): 70 Exam by:: Ginger Morris RN  Fern positive  Prenatal labs: ABO, Rh: B/NEG/-- (02/02 1121) Antibody: NEG (02/02 1121) Rubella:  immune  RPR: NON REAC (07/01 1143)  HBsAg: NEGATIVE (02/02 1121)  HIV: NONREACTIVE (07/01 1143)  GBS:   negative  1 hr Glucola 160 3hr GTT: [phone] Genetic screening  Normal Anatomy US Normal  24.6wk [place]: EFW 1 lb, 11 oz, 57%; posterior placenta   Prenatal Transfer Tool  Maternal Diabetes: No Genetic Screening: Normal Maternal Ultrasounds/Referrals: Normal Fetal Ultrasounds or other Referrals:  None Maternal Substance Abuse:  No Significant Maternal Medications:  None Significant Maternal Lab Results: Lab values include: Group B Strep negative   Assessment: Anne Phelps is a 28 y.o. G1P0 at [redacted]w[redacted]d by 8.1wk U/S not c/w LMP presenting with ROM  #Labor: Will admit to laboring suits. Will  progress with cytotec vs foley bulb #Pain: Fentanyl PRN. Will want an epidural #FWB: Cat 1 #ID:  GBS neg #MOF: Breast #MOC: POPs #Circ:  Girl #Elevated BPs: BP on presentation 138/116 (unsure if this was correct give narrow pulse pressure). Repeat BP 152/92. Brisk reflexes on exam. Currently asymptomatic. Will check CMET, CBC, and urine protein/creatine [name] 11/02/2013, 1:55 PM Evaluation and management procedures were performed by Resident physician under my  supervision/collaboration. Chart reviewed, patient examined by me and I agree with management and plan.

## 2013-11-02 NOTE — Progress Notes (Addendum)
Anne Phelps is a 28 y.o. G1P0 at [redacted]w[redacted]d by ultrasound admitted for rupture of membranes  Subjective: Not feeling pain with contractions   Objective: BP 131/74  Pulse 80  Temp(Src) 98.8 F (37.1 C) (Oral)  Resp 18  Ht  (1.676 m)  Wt 189 lb (85.73 kg)  BMI 30.52 kg/m2  LMP 01/07/2013      FHT:  FHR: 135 bpm, variability: moderate,  accelerations:  Present,  decelerations:  Absent UC:   irregular, every 2-4 minutes SVE:   Dilation: 1.5 Effacement (%): 70 Exam by:: Ginger Morris RN  Labs: Lab Results  Component Value Date   WBC 12.7* 11/02/2013   HGB 11.1* 11/02/2013   HCT 32.7* 11/02/2013   MCV 86.1 11/02/2013   PLT 214 11/02/2013    Assessment / Plan: PROM at term, latent phase labor, 12.5 hours post ROM  Labor: Latent labor, not painful Preeclampsia:  n/a Fetal Wellbeing:  Category I Pain Control:  Labor support without medications I/D:  n/a Anticipated MOD:  NSVD  Plan:  Discussed options of expectant management vs Pitocin Augmentaiton           Patient would like to start Pitocin [facility] 11/02/2013, 8:58 PM

## 2013-11-03 ENCOUNTER — Inpatient Hospital Stay (HOSPITAL_COMMUNITY): Payer: No Typology Code available for payment source | Admitting: Anesthesiology

## 2013-11-03 ENCOUNTER — Encounter (HOSPITAL_COMMUNITY): Payer: Self-pay | Admitting: *Deleted

## 2013-11-03 ENCOUNTER — Encounter (HOSPITAL_COMMUNITY): Payer: No Typology Code available for payment source | Admitting: Anesthesiology

## 2013-11-03 DIAGNOSIS — O4290 Premature rupture of membranes, unspecified as to length of time between rupture and onset of labor, unspecified weeks of gestation: Secondary | ICD-10-CM

## 2013-11-03 LAB — ABO/RH: ABO/RH(D): B NEG

## 2013-11-03 LAB — TYPE AND SCREEN
ABO/RH(D): B NEG
Antibody Screen: NEGATIVE

## 2013-11-03 MED ORDER — ZOLPIDEM TARTRATE 5 MG PO TABS: 5.0000 mg | ORAL_TABLET | Freq: Every evening | ORAL | Status: DC | PRN

## 2013-11-03 MED ORDER — LANOLIN HYDROUS EX OINT: TOPICAL_OINTMENT | CUTANEOUS | Status: DC | PRN

## 2013-11-03 MED ORDER — ONDANSETRON HCL 4 MG PO TABS: 4.0000 mg | ORAL_TABLET | ORAL | Status: DC | PRN

## 2013-11-03 MED ORDER — TETANUS-DIPHTH-ACELL PERTUSSIS 5-2.5-18.5 LF-MCG/0.5 IM SUSP: 0.5000 mL | Freq: Once | INTRAMUSCULAR | Status: DC

## 2013-11-03 MED ORDER — OXYCODONE-ACETAMINOPHEN 5-325 MG PO TABS: 2.0000 | ORAL_TABLET | ORAL | Status: DC | PRN

## 2013-11-03 MED ORDER — DIPHENHYDRAMINE HCL 25 MG PO CAPS: 25.0000 mg | ORAL_CAPSULE | Freq: Four times a day (QID) | ORAL | Status: DC | PRN

## 2013-11-03 MED ORDER — FENTANYL 2.5 MCG/ML BUPIVACAINE 1/10 % EPIDURAL INFUSION (WH - ANES)
INTRAMUSCULAR | Status: DC | PRN
  Administered 2013-11-03: 14 mL/h via EPIDURAL

## 2013-11-03 MED ORDER — FENTANYL CITRATE 0.05 MG/ML IJ SOLN: 100.0000 ug | INTRAMUSCULAR | Status: DC | PRN

## 2013-11-03 MED ORDER — BENZOCAINE-MENTHOL 20-0.5 % EX AERO
1.0000 "application " | INHALATION_SPRAY | CUTANEOUS | Status: DC | PRN
  Administered 2013-11-03: 1 via TOPICAL
  Filled 2013-11-03 (×2): qty 56

## 2013-11-03 MED ORDER — PRENATAL MULTIVITAMIN CH
1.0000 | ORAL_TABLET | Freq: Every day | ORAL | Status: DC
  Administered 2013-11-03 – 2013-11-05 (×3): 1 via ORAL
  Filled 2013-11-03 (×3): qty 1

## 2013-11-03 MED ORDER — SIMETHICONE 80 MG PO CHEW: 80.0000 mg | CHEWABLE_TABLET | ORAL | Status: DC | PRN

## 2013-11-03 MED ORDER — WITCH HAZEL-GLYCERIN EX PADS: 1.0000 "application " | MEDICATED_PAD | CUTANEOUS | Status: DC | PRN

## 2013-11-03 MED ORDER — ONDANSETRON HCL 4 MG/2ML IJ SOLN: 4.0000 mg | INTRAMUSCULAR | Status: DC | PRN

## 2013-11-03 MED ORDER — OXYCODONE-ACETAMINOPHEN 5-325 MG PO TABS
1.0000 | ORAL_TABLET | ORAL | Status: DC | PRN
  Administered 2013-11-03 – 2013-11-04 (×2): 1 via ORAL
  Filled 2013-11-03 (×2): qty 1

## 2013-11-03 MED ORDER — IBUPROFEN 600 MG PO TABS
600.0000 mg | ORAL_TABLET | Freq: Four times a day (QID) | ORAL | Status: DC
  Administered 2013-11-03 – 2013-11-05 (×9): 600 mg via ORAL
  Filled 2013-11-03 (×9): qty 1

## 2013-11-03 MED ORDER — SENNOSIDES-DOCUSATE SODIUM 8.6-50 MG PO TABS
2.0000 | ORAL_TABLET | ORAL | Status: DC
  Administered 2013-11-03: 2 via ORAL
  Filled 2013-11-03: qty 2

## 2013-11-03 MED ORDER — OXYTOCIN 40 UNITS IN LACTATED RINGERS INFUSION - SIMPLE MED: 1.0000 m[IU]/min | INTRAVENOUS | Status: DC

## 2013-11-03 MED ORDER — LIDOCAINE HCL (PF) 1 % IJ SOLN
INTRAMUSCULAR | Status: DC | PRN
Start: 2013-11-03 — End: 2013-11-04
  Administered 2013-11-03 (×2): 5 mL

## 2013-11-03 MED ORDER — DIBUCAINE 1 % RE OINT
1.0000 "application " | TOPICAL_OINTMENT | RECTAL | Status: DC | PRN
  Filled 2013-11-03: qty 28

## 2013-11-03 NOTE — Progress Notes (Signed)
Patient ID: Anne Phelps, female   DOB: December 14, 1985, 28 y.o.   MRN: [id number]  Started pushing at about 0715 or so.  Pushing well, changing positions.  FHR stable with small variable decels with pushes  UCs every 2 minutes  Pushing to +1 station.  Will continue second stage.

## 2013-11-03 NOTE — Anesthesia Preprocedure Evaluation (Signed)
Anesthesia Evaluation  Patient identified by MRN, date of birth, ID band Patient awake    Reviewed: Allergy & Precautions, H&P , Patient's Chart, lab work & pertinent test results  Airway Mallampati: II TM Distance: >3 FB Neck ROM: full    Dental   Pulmonary  breath sounds clear to auscultation        Cardiovascular Rhythm:regular Rate:Normal     Neuro/Psych    GI/Hepatic   Endo/Other    Renal/GU      Musculoskeletal   Abdominal   Peds  Hematology   Anesthesia Other Findings Autoimmune disorder  Reproductive/Obstetrics (+) Pregnancy                           Anesthesia Physical Anesthesia Plan  ASA: III  Anesthesia Plan: Epidural   Post-op Pain Management:    Induction:   Airway Management Planned:   Additional Equipment:   Intra-op Plan:   Post-operative Plan:   Informed Consent: I have reviewed the patients History and Physical, chart, labs and discussed the procedure including the risks, benefits and alternatives for the proposed anesthesia with the patient or authorized representative who has indicated his/her understanding and acceptance.     Plan Discussed with:   Anesthesia Plan Comments:         Anesthesia Quick Evaluation

## 2013-11-03 NOTE — Anesthesia Postprocedure Evaluation (Signed)
Anesthesia Post Note  Patient: Anne Phelps  Procedure(s) Performed: * No procedures listed *  Anesthesia type: Epidural  Patient location: Mother/Baby  Post pain: Pain level controlled  Post assessment: Post-op Vital signs reviewed  Last Vitals:  Filed Vitals:   11/03/13 1543  BP: 128/81  Pulse: 83  Temp: 36.9 C  Resp: 18    Post vital signs: Reviewed  Level of consciousness:alert  Complications: No apparent anesthesia complications

## 2013-11-03 NOTE — Anesthesia Procedure Notes (Signed)
Epidural Patient location during procedure: OB Start time: 11/03/2013 1:27 AM  Staffing Anesthesiologist: Brayton Caves Performed by: anesthesiologist   Preanesthetic Checklist Completed: patient identified, site marked, surgical consent, pre-op evaluation, timeout performed, IV checked, risks and benefits discussed and monitors and equipment checked  Epidural Patient position: sitting Prep: site prepped and draped and DuraPrep Patient monitoring: continuous pulse ox and blood pressure Approach: midline Location: L3-L4 Injection technique: LOR air  Needle:  Needle type: Tuohy  Needle gauge: 17 G Needle length: 9 cm and 9 Needle insertion depth: 5 cm cm Catheter type: closed end flexible Catheter size: 19 Gauge Catheter at skin depth: 10 cm Test dose: negative  Assessment Events: blood not aspirated, injection not painful, no injection resistance, negative IV test and no paresthesia  Additional Notes Patient identified.  Risk benefits discussed including failed block, incomplete pain control, headache, nerve damage, paralysis, blood pressure changes, nausea, vomiting, reactions to medication both toxic or allergic, and postpartum back pain.  Patient expressed understanding and wished to proceed.  All questions were answered.  Sterile technique used throughout procedure and epidural site dressed with sterile barrier dressing. No paresthesia or other complications noted.The patient did not experience any signs of intravascular injection such as tinnitus or metallic taste in mouth nor signs of intrathecal spread such as rapid motor block. Please see nursing notes for vital signs.

## 2013-11-03 NOTE — Progress Notes (Signed)
Patient ID: Anne Phelps, female   DOB: Oct 05, 1985, 28 y.o.   MRN: [id number] Comfortable with epidural  FHR stable UCs every 1.5-2 min  Dilation: 4 Effacement (%): 100 Cervical Position: Posterior Station: -2 Presentation: Vertex Exam by:: Wynelle Bourgeois, CNM  Will continue to observe. Consider IUPC if no change at next check.

## 2013-11-03 NOTE — Progress Notes (Signed)
Patient ID: Anne Phelps, female   DOB: 1985/11/06, 28 y.o.   MRN: [id number]  S/O: Doing well, not hurting much  Filed Vitals:   11/02/13 2231 11/02/13 2301 11/02/13 2332 11/03/13 0001  BP: 115/58 116/68 123/75 121/67  Pulse: 78 78 73 61  Temp:      TempSrc:      Resp:      Height:      Weight:       FHR reassuring UCs irregular  Dilation: 1.5 Effacement (%): 70 Presentation: Vertex Exam by:: Ginger Morris RN  Will limit cervical exams as possible, but check as needed  Continue Pitocin. Currently at 93mu/min  [name], CNM

## 2013-11-04 NOTE — Lactation Note (Signed)
This note was copied from the chart of Anne Eddie Koc. Lactation Consultation Note Mom resting in bed awake. Baby in [place]. Introduced myself asked how the BF was going. Mom stated that she was doing fair with the BF but she doesn't have any milk and the baby was very fussy and gave formula. Asked mom was she able to hand express, she stated I did but nothing is coming out. Mom states that when she gets home she has a pump and is going to pump. Offered to let her use hospital pump or give her hand pump and declined it. Discussed formula feeding verses BF and the guideline amount. Mom encouraged to feed baby 8-12 times/24 hours and with feeding cues. Mom encouraged to waken baby for feeds. Mom encouraged to do skin-to-skin.  Educated about newborn behavior. Referred to Baby and Me Book in Breastfeeding section Pg. 22-23 for position options and Proper latch demonstration.Encouraged comfort during BF so colostrum flows better and mom will enjoy the feeding longer. Taking deep breaths and breast massage during BF. Encouraged to call for assistance if needed and to verify proper latch.WH/LC brochure given w/resources, support groups and LC services. Patient Name: Anne Phelps [id number] Date: 11/04/2013 Reason for consult: Initial assessment   Maternal Data Has patient been taught Hand Expression?: Yes Does the patient have breastfeeding experience prior to this delivery?: No  Feeding    LATCH Score/Interventions                      Lactation Tools Discussed/Used     Consult Status Consult Status: Follow-up Date: 11/04/13 Follow-up type: In-patient    Charyl Dancer 11/04/2013, 5:57 AM

## 2013-11-04 NOTE — Progress Notes (Signed)
Post Partum Day 1 Subjective: no complaints, up ad lib, voiding and tolerating PO  Objective: Blood pressure 120/63, pulse 85, temperature 98.3 F (36.8 C), temperature source Oral, resp. rate 17, height  (1.676 m), weight 85.73 kg (189 lb), last menstrual period 01/07/2013, SpO2 99.00%, unknown if currently breastfeeding.  Physical Exam:  General: alert, cooperative and no distress Lochia: appropriate Uterine Fundus: firm DVT Evaluation: No evidence of DVT seen on physical exam. Negative Homan's sign. No cords or calf tenderness.   Recent Labs  11/02/13 1430  HGB 11.1*  HCT 32.7*    Assessment/Plan: Plan for discharge tomorrow, Breastfeeding and Lactation consult   LOS: 2 days   Amen Dargis JEHIEL 11/04/2013, 9:23 AM

## 2013-11-05 ENCOUNTER — Encounter: Payer: No Typology Code available for payment source | Admitting: Family Medicine

## 2013-11-05 ENCOUNTER — Encounter (INDEPENDENT_AMBULATORY_CARE_PROVIDER_SITE_OTHER): Payer: No Typology Code available for payment source | Admitting: *Deleted

## 2013-11-05 MED ORDER — IBUPROFEN 600 MG PO TABS: 600.0000 mg | ORAL_TABLET | Freq: Four times a day (QID) | ORAL | Status: DC

## 2013-11-05 NOTE — Discharge Summary (Signed)
Obstetric Discharge Summary Reason for Admission: rupture of membranes Prenatal Procedures: none Intrapartum Procedures: spontaneous vaginal delivery Postpartum Procedures: none Complications-Operative and Postpartum: 1st degree perineal laceration  Delivery Note At 8:57 AM a viable female was delivered via Vaginal, Spontaneous Delivery (Presentation: ;  ).  APGAR: 9, 9; weight 7 lb 10.1 oz (3460 g).   Placenta status: Intact, Spontaneous.  Cord: 3 vessels with the following complications: None.  Cord pH: not obtained  Anesthesia: Epidural  Episiotomy: None Lacerations: 1st degree;Perineal Suture Repair: 3.0 vicryl Est. Blood Loss (mL): 200  Mom to postpartum.  Baby to Couplet care / Skin to Skin.  Anne Phelps 11/05/2013, 9:05 AM     Hospital Course:  Active Problems:   Pregnancy   Today: No acute events overnight.  Pt denies problems with ambulating, voiding or po intake.  She denies nausea or vomiting.  Pain is well controlled.  She has had flatus. She has had bowel movement.  Lochia Minimal.  Plan for birth control is  Nexplanon.  Method of Feeding: Breast + formula  Anne Phelps is a 28 y.o. G1P1001 s/p NSVD.  Patient presented to OBT w/ SROM and was admitted to L&D.  She has postpartum course that was uncomplicated including no problems with ambulating, PO intake, urination, pain, or bleeding. The pt feels ready to go home and  will be discharged with outpatient follow-up.    H/H: Lab Results  Component Value Date/Time   HGB 11.1* 11/02/2013  2:30 PM   HCT 32.7* 11/02/2013  2:30 PM    Discharge Diagnoses: Term Pregnancy-delivered  Discharge Information: Date: 11/05/2013 Activity: pelvic rest Diet: routine  Medications: PNV and Ibuprofen Breast feeding:  Yes Condition: stable Instructions: refer to handout Discharge to: home   Discharge Instructions   Activity as tolerated    Complete by:  As directed      Call MD for:  difficulty breathing,  headache or visual disturbances    Complete by:  As directed      Call MD for:  extreme fatigue    Complete by:  As directed      Call MD for:  hives    Complete by:  As directed      Call MD for:  persistant dizziness or light-headedness    Complete by:  As directed      Call MD for:  persistant nausea and vomiting    Complete by:  As directed      Call MD for:  severe uncontrolled pain    Complete by:  As directed      Call MD for:  temperature >100.4    Complete by:  As directed      Diet - low sodium heart healthy    Complete by:  As directed      Discharge instructions    Complete by:  As directed   Taking care of yourself after Baby arrives. Vaginal Bleeding: Vaginal bleeding is common after delivery, with the amount decreasing gradually over 1-2 weeks. If the bleeding increases, is mixed with pus, or is foul-smelling, call your doctor, as this may be a sign of infection.  Abdominal Pain: Abdominal cramping after delivery is common, especially when you breastfeed. The same hormones responsible for letting milk down to your nipple also contract your uterus. If the pain worsens, or occurs more frequently over 48 hrs after delivery, call your doctor.  Fevers: After delivery you are at increased risk of developing an infection. If you  have a fever, increased vaginal bleeding, foul-smelling vaginal discharge, or increased abdominal pain, call your doctor.  Breast Feeding: Feeding every 1.5-3 hours keeps Baby satisfied and your milk in good supply. If 3 hours have gone by and Baby is sleeping, wake him/her up to feed. Nurse for 15-20 minutes on one breast before offering the other. Breast-fed babies often lose up to 7% of their birth weight in the first few days of life, but should start gaining about an ounce per day after 4 days. For more information about breastfeeding, go to [employer].  Mastitis (Breast infection): Breaks in the skin or bacteria passing into  your breast ducts can cause an infection. If you notice a triangular shaped area on your breast that is red, warm to the touch and tender, call your doctor. It is safe for Baby and helps you to heal faster if you keep breast feeding through this infection.  Postpartum Depression: Postpartum depression is very common after a woman delivers because of all the hormonal changes happening in her body. If you notice that you start to feel more sad or anxious than usual or have any thoughts of hurting yourself or Baby, tell someone right away.  If you have any questions or concerns, please call your doctor.            Medication List         Breast Pump Misc  Dispense one breast pump for patient     ibuprofen 600 MG tablet  Commonly known as:  ADVIL,MOTRIN  Take 1 tablet (600 mg total) by mouth every 6 (six) hours.     prenatal multivitamin Tabs tablet  Take 1 tablet by mouth daily at 12 noon.         Anne Phelps ,MD OB Fellow 11/05/2013,9:05 AM

## 2013-11-05 NOTE — Lactation Note (Signed)
This note was copied from the chart of Anne Riya Huxford. Lactation Consultation Note: Follow up visit with mom before DC. Mom reports that she is having a hard time with breast feeding and she plans to just pump and bottle feed formula and EBM. Mom states she has Nuk DEBP for home use. Has pump here but has not started pumping yet. Encouraged to pump q 3 hours for 15-20 min to promote milk supply. States baby is very impatient at the breast. States they tried the NS but she will take a few sucks then come off and is fussy. Suggested putting EBM or formula in the NS to keep her going. Curved tip syringe given with instructions for use. Offered assist while still here but mom refused. Reviewed OP appointments as resources for support after DC. TO call and make appointment as needed/desired. No questions at present.   Patient Name: Anne Phelps [id number] Date: 11/05/2013 Reason for consult: Follow-up assessment   Maternal Data Formula Feeding for Exclusion: No  Feeding    LATCH Score/Interventions                      Lactation Tools Discussed/Used     Consult Status Consult Status: Complete    Pamelia Hoit 11/05/2013, 8:22 AM

## 2013-11-05 NOTE — Progress Notes (Signed)
FMLA forms filled out and faxed to patients employer per her request.

## 2013-11-06 NOTE — Discharge Summary (Signed)
Attestation of Attending Supervision of Obstetric Fellow: Evaluation and management procedures were performed by the Obstetric Fellow under my supervision and collaboration.  I have reviewed the Obstetric Fellow's note and chart, and I agree with the management and plan.  Jacob Stinson, DO Attending Physician Faculty Practice, [facility]  

## 2013-11-29 ENCOUNTER — Encounter: Payer: Self-pay | Admitting: Family Medicine

## 2013-11-29 ENCOUNTER — Ambulatory Visit (INDEPENDENT_AMBULATORY_CARE_PROVIDER_SITE_OTHER): Payer: No Typology Code available for payment source | Admitting: Family Medicine

## 2013-11-29 VITALS — BP 125/78 | HR 68 | Ht 65.0 in | Wt 168.0 lb

## 2013-11-29 DIAGNOSIS — O99345 Other mental disorders complicating the puerperium: Principal | ICD-10-CM

## 2013-11-29 DIAGNOSIS — Z30011 Encounter for initial prescription of contraceptive pills: Secondary | ICD-10-CM

## 2013-11-29 DIAGNOSIS — F53 Postpartum depression: Secondary | ICD-10-CM

## 2013-11-29 HISTORY — DX: Postpartum depression: F53.0

## 2013-11-29 MED ORDER — NORETHIN ACE-ETH ESTRAD-FE 1-20 MG-MCG PO TABS: 1.0000 | ORAL_TABLET | Freq: Every day | ORAL | Status: DC

## 2013-11-29 MED ORDER — CITALOPRAM HYDROBROMIDE 20 MG PO TABS: 20.0000 mg | ORAL_TABLET | Freq: Every day | ORAL | Status: DC

## 2013-11-29 NOTE — Patient Instructions (Signed)
Postpartum Depression and Baby Blues The postpartum period begins right after the birth of a baby. During this time, there is often a great amount of joy and excitement. It is also a time of many changes in the life of the parents. Regardless of how many times a mother gives birth, each child brings new challenges and dynamics to the family. It is not unusual to have feelings of excitement along with confusing shifts in moods, emotions, and thoughts. All mothers are at risk of developing postpartum depression or the "baby blues." These mood changes can occur right after giving birth, or they may occur many months after giving birth. The baby blues or postpartum depression can be mild or severe. Additionally, postpartum depression can go away rather quickly, or it can be a long-term condition.  CAUSES Raised hormone levels and the rapid drop in those levels are thought to be a main cause of postpartum depression and the baby blues. A number of hormones change during and after pregnancy. Estrogen and progesterone usually decrease right after the delivery of your baby. The levels of thyroid hormone and various cortisol steroids also rapidly drop. Other factors that play a role in these mood changes include major life events and genetics.  RISK FACTORS If you have any of the following risks for the baby blues or postpartum depression, know what symptoms to watch out for during the postpartum period. Risk factors that may increase the likelihood of getting the baby blues or postpartum depression include:  Having a personal or family history of depression.   Having depression while being pregnant.   Having premenstrual mood issues or mood issues related to oral contraceptives.  Having a lot of life stress.   Having marital conflict.   Lacking a social support network.   Having a baby with special needs.   Having health problems, such as diabetes.  SIGNS AND SYMPTOMS Symptoms of baby blues  include:  Brief changes in mood, such as going from extreme happiness to sadness.  Decreased concentration.   Difficulty sleeping.   Crying spells, tearfulness.   Irritability.   Anxiety.  Symptoms of postpartum depression typically begin within the first month after giving birth. These symptoms include:  Difficulty sleeping or excessive sleepiness.   Marked weight loss.   Agitation.   Feelings of worthlessness.   Lack of interest in activity or food.  Postpartum psychosis is a very serious condition and can be dangerous. Fortunately, it is rare. Displaying any of the following symptoms is cause for immediate medical attention. Symptoms of postpartum psychosis include:   Hallucinations and delusions.   Bizarre or disorganized behavior.   Confusion or disorientation.  DIAGNOSIS  A diagnosis is made by an evaluation of your symptoms. There are no medical or lab tests that lead to a diagnosis, but there are various questionnaires that a health care provider may use to identify those with the baby blues, postpartum depression, or psychosis. Often, a screening tool called the Edinburgh Postnatal Depression Scale is used to diagnose depression in the postpartum period.  TREATMENT The baby blues usually goes away on its own in 1-2 weeks. Social support is often all that is needed. You will be encouraged to get adequate sleep and rest. Occasionally, you may be given medicines to help you sleep.  Postpartum depression requires treatment because it can last several months or longer if it is not treated. Treatment may include individual or group therapy, medicine, or both to address any social, physiological, and psychological   factors that may play a role in the depression. Regular exercise, a healthy diet, rest, and social support may also be strongly recommended.  Postpartum psychosis is more serious and needs treatment right away. Hospitalization is often needed. HOME CARE  INSTRUCTIONS  Get as much rest as you can. Nap when the baby sleeps.   Exercise regularly. Some women find yoga and walking to be beneficial.   Eat a balanced and nourishing diet.   Do little things that you enjoy. Have a cup of tea, take a bubble bath, read your favorite magazine, or listen to your favorite music.  Avoid alcohol.   Ask for help with household chores, cooking, grocery shopping, or running errands as needed. Do not try to do everything.   Talk to people close to you about how you are feeling. Get support from your partner, family members, friends, or other new moms.  Try to stay positive in how you think. Think about the things you are grateful for.   Do not spend a lot of time alone.   Only take over-the-counter or prescription medicine as directed by your health care provider.  Keep all your postpartum appointments.   Let your health care provider know if you have any concerns.  SEEK MEDICAL CARE IF: You are having a reaction to or problems with your medicine. SEEK IMMEDIATE MEDICAL CARE IF:  You have suicidal feelings.   You think you may harm the baby or someone else. MAKE SURE YOU:  Understand these instructions.  Will watch your condition.  Will get help right away if you are not doing well or get worse. Document Released: 11/05/2003 Document Revised: 02/05/2013 Document Reviewed: 11/12/2012 [employer] Patient Information 2015 [employer]. This information is not intended to replace advice given to you by your health care provider. Make sure you discuss any questions you have with your health care provider.  

## 2013-11-29 NOTE — Progress Notes (Signed)
  Subjective:     Anne Phelps is a 28 y.o. female who presents for a postpartum visit. She is 6 weeks postpartum following a spontaneous vaginal delivery. I have fully reviewed the prenatal and intrapartum course. The delivery was at 39 gestational weeks. Outcome: spontaneous vaginal delivery. Anesthesia: epidural. Postpartum course has been unremarkable. Baby's course has been normal. Baby is feeding by bottle - Similac. Bleeding no bleeding. Bowel function is normal. Bladder function is normal. Patient is not sexually active. Contraception method is none. Postpartum depression screening: positive.  The following portions of the patient's history were reviewed and updated as appropriate: allergies, current medications, past family history, past medical history, past social history, past surgical history and problem list.  Review of Systems A comprehensive review of systems was negative.   Objective:    BP 125/78  Pulse 68  Ht 5\' 5"  (1.651 m)  Wt 168 lb (76.204 kg)  BMI 27.96 kg/m2  Breastfeeding? No  General:  alert, cooperative and appears stated age   Vulva:  normal  Vagina: normal vagina and suture noted at introitus. removed easily  Cervix:  multiparous appearance  Corpus: normal size, contour, position, consistency, mobility, non-tender  Adnexa:  normal adnexa        Assessment:     6 wk postpartum exam. Pap smear not done at today's visit.   Plan:    1. Contraception: OCP (estrogen/progesterone) 2. Pp Depression--started on celexa--advised to slowly taper up and back off--normal course--if symptoms worsen, she is to return. 3. Follow up in: 1 year or as needed.

## 2013-12-11 ENCOUNTER — Ambulatory Visit (INDEPENDENT_AMBULATORY_CARE_PROVIDER_SITE_OTHER): Payer: No Typology Code available for payment source | Admitting: Family Medicine

## 2013-12-11 ENCOUNTER — Encounter: Payer: Self-pay | Admitting: Family Medicine

## 2013-12-11 VITALS — BP 127/72 | HR 73 | Ht 65.0 in | Wt 167.4 lb

## 2013-12-11 DIAGNOSIS — O99345 Other mental disorders complicating the puerperium: Secondary | ICD-10-CM

## 2013-12-11 DIAGNOSIS — Z23 Encounter for immunization: Secondary | ICD-10-CM

## 2013-12-11 DIAGNOSIS — F53 Postpartum depression: Secondary | ICD-10-CM

## 2013-12-11 MED ORDER — BUPROPION HCL ER (SR) 150 MG PO TB12: 150.0000 mg | ORAL_TABLET | Freq: Two times a day (BID) | ORAL | Status: DC

## 2013-12-11 NOTE — Progress Notes (Signed)
    Subjective:    Patient ID: Anne SpeedStacy K Phelps is a 28 y.o. female presenting with Follow-up  on 12/11/2013  HPI: Had pp depression.  Started on Celexa but too much headaches and did not have long enough to see if mood improved.  Review of Systems  Constitutional: Negative for fever and chills.  Respiratory: Negative for shortness of breath.   Cardiovascular: Negative for chest pain.  Gastrointestinal: Negative for nausea, vomiting and abdominal pain.  Genitourinary: Negative for dysuria.  Skin: Negative for rash.      Objective:    BP 127/72  Pulse 73  Ht 5\' 5"  (1.651 m)  Wt 167 lb 6.4 oz (75.932 kg)  BMI 27.86 kg/m2  Breastfeeding? No Physical Exam  Constitutional: She is oriented to person, place, and time. She appears well-developed and well-nourished. No distress.  HENT:  Head: Normocephalic and atraumatic.  Eyes: No scleral icterus.  Neck: Neck supple.  Cardiovascular: Normal rate.   Pulmonary/Chest: Effort normal.  Abdominal: Soft.  Neurological: She is alert and oriented to person, place, and time.  Skin: Skin is warm and dry.  Psychiatric: Her affect is blunt. Her affect is not angry and not labile. She is slowed. She is not agitated and not actively hallucinating. She expresses no suicidal ideation. She expresses no suicidal plans.        Assessment & Plan:   Problem List Items Addressed This Visit     Unprioritized   Postpartum depression     Due to side effects will change to Wellbutrin.    Relevant Medications      buPROPion (WELLBUTRIN SR) 12 hr tablet    Other Visit Diagnoses   Need for immunization against influenza    -  Primary    Relevant Orders       Flu Vaccine QUAD 36+ mos IM (Fluarix) (Completed)

## 2013-12-11 NOTE — Progress Notes (Signed)
Patient is here to discuss she stopped the citalopram due to side effects.  She would also like to get a flu vaccine today.

## 2013-12-11 NOTE — Assessment & Plan Note (Signed)
Due to side effects will change to Wellbutrin.

## 2013-12-11 NOTE — Patient Instructions (Signed)
Postpartum Depression and Baby Blues The postpartum period begins right after the birth of a baby. During this time, there is often a great amount of joy and excitement. It is also a time of many changes in the life of the parents. Regardless of how many times a mother gives birth, each child brings new challenges and dynamics to the family. It is not unusual to have feelings of excitement along with confusing shifts in moods, emotions, and thoughts. All mothers are at risk of developing postpartum depression or the "baby blues." These mood changes can occur right after giving birth, or they may occur many months after giving birth. The baby blues or postpartum depression can be mild or severe. Additionally, postpartum depression can go away rather quickly, or it can be a long-term condition.  CAUSES Raised hormone levels and the rapid drop in those levels are thought to be a main cause of postpartum depression and the baby blues. A number of hormones change during and after pregnancy. Estrogen and progesterone usually decrease right after the delivery of your baby. The levels of thyroid hormone and various cortisol steroids also rapidly drop. Other factors that play a role in these mood changes include major life events and genetics.  RISK FACTORS If you have any of the following risks for the baby blues or postpartum depression, know what symptoms to watch out for during the postpartum period. Risk factors that may increase the likelihood of getting the baby blues or postpartum depression include:  Having a personal or family history of depression.   Having depression while being pregnant.   Having premenstrual mood issues or mood issues related to oral contraceptives.  Having a lot of life stress.   Having marital conflict.   Lacking a social support network.   Having a baby with special needs.   Having health problems, such as diabetes.  SIGNS AND SYMPTOMS Symptoms of baby blues  include:  Brief changes in mood, such as going from extreme happiness to sadness.  Decreased concentration.   Difficulty sleeping.   Crying spells, tearfulness.   Irritability.   Anxiety.  Symptoms of postpartum depression typically begin within the first month after giving birth. These symptoms include:  Difficulty sleeping or excessive sleepiness.   Marked weight loss.   Agitation.   Feelings of worthlessness.   Lack of interest in activity or food.  Postpartum psychosis is a very serious condition and can be dangerous. Fortunately, it is rare. Displaying any of the following symptoms is cause for immediate medical attention. Symptoms of postpartum psychosis include:   Hallucinations and delusions.   Bizarre or disorganized behavior.   Confusion or disorientation.  DIAGNOSIS  A diagnosis is made by an evaluation of your symptoms. There are no medical or lab tests that lead to a diagnosis, but there are various questionnaires that a health care provider may use to identify those with the baby blues, postpartum depression, or psychosis. Often, a screening tool called the Edinburgh Postnatal Depression Scale is used to diagnose depression in the postpartum period.  TREATMENT The baby blues usually goes away on its own in 1-2 weeks. Social support is often all that is needed. You will be encouraged to get adequate sleep and rest. Occasionally, you may be given medicines to help you sleep.  Postpartum depression requires treatment because it can last several months or longer if it is not treated. Treatment may include individual or group therapy, medicine, or both to address any social, physiological, and psychological   factors that may play a role in the depression. Regular exercise, a healthy diet, rest, and social support may also be strongly recommended.  Postpartum psychosis is more serious and needs treatment right away. Hospitalization is often needed. HOME CARE  INSTRUCTIONS  Get as much rest as you can. Nap when the baby sleeps.   Exercise regularly. Some women find yoga and walking to be beneficial.   Eat a balanced and nourishing diet.   Do little things that you enjoy. Have a cup of tea, take a bubble bath, read your favorite magazine, or listen to your favorite music.  Avoid alcohol.   Ask for help with household chores, cooking, grocery shopping, or running errands as needed. Do not try to do everything.   Talk to people close to you about how you are feeling. Get support from your partner, family members, friends, or other new moms.  Try to stay positive in how you think. Think about the things you are grateful for.   Do not spend a lot of time alone.   Only take over-the-counter or prescription medicine as directed by your health care provider.  Keep all your postpartum appointments.   Let your health care provider know if you have any concerns.  SEEK MEDICAL CARE IF: You are having a reaction to or problems with your medicine. SEEK IMMEDIATE MEDICAL CARE IF:  You have suicidal feelings.   You think you may harm the baby or someone else. MAKE SURE YOU:  Understand these instructions.  Will watch your condition.  Will get help right away if you are not doing well or get worse. Document Released: 11/05/2003 Document Revised: 02/05/2013 Document Reviewed: 11/12/2012 [employer] Patient Information 2015 [employer]. This information is not intended to replace advice given to you by your health care provider. Make sure you discuss any questions you have with your health care provider.  

## 2013-12-16 ENCOUNTER — Encounter: Payer: Self-pay | Admitting: Family Medicine

## 2013-12-25 ENCOUNTER — Encounter: Payer: No Typology Code available for payment source | Admitting: Obstetrics & Gynecology

## 2014-03-18 ENCOUNTER — Other Ambulatory Visit: Payer: Self-pay | Admitting: Family Medicine

## 2014-04-13 ENCOUNTER — Other Ambulatory Visit: Payer: Self-pay | Admitting: Family Medicine

## 2014-05-12 ENCOUNTER — Other Ambulatory Visit: Payer: Self-pay | Admitting: Family Medicine

## 2014-06-11 ENCOUNTER — Other Ambulatory Visit: Payer: Self-pay | Admitting: Family Medicine

## 2014-07-05 ENCOUNTER — Other Ambulatory Visit: Payer: Self-pay | Admitting: Family Medicine

## 2014-07-30 ENCOUNTER — Other Ambulatory Visit: Payer: Self-pay | Admitting: Family Medicine

## 2014-08-25 ENCOUNTER — Other Ambulatory Visit: Payer: Self-pay | Admitting: Obstetrics & Gynecology

## 2014-09-22 ENCOUNTER — Other Ambulatory Visit: Payer: Self-pay | Admitting: Obstetrics & Gynecology

## 2014-10-24 ENCOUNTER — Other Ambulatory Visit: Payer: Self-pay | Admitting: Obstetrics & Gynecology

## 2014-11-10 ENCOUNTER — Other Ambulatory Visit: Payer: Self-pay | Admitting: Obstetrics & Gynecology

## 2014-11-10 ENCOUNTER — Telehealth: Payer: Self-pay | Admitting: *Deleted

## 2014-11-10 DIAGNOSIS — Z3041 Encounter for surveillance of contraceptive pills: Secondary | ICD-10-CM

## 2014-11-10 MED ORDER — NORETHIN ACE-ETH ESTRAD-FE 1-20 MG-MCG PO TABS: 1.0000 | ORAL_TABLET | Freq: Every day | ORAL | Status: DC

## 2014-11-10 NOTE — Telephone Encounter (Signed)
Sent rx to pharmacy, left message to inform pt that rx had been sent to pharmacy.

## 2014-11-10 NOTE — Telephone Encounter (Signed)
-----   Message from Pennie Banter sent at 11/10/2014  3:57 PM EDT ----- Needs one refill of her Junel to [facility].  She has made an appointment for 12/15/14.

## 2014-12-15 ENCOUNTER — Ambulatory Visit: Payer: Self-pay | Admitting: Obstetrics & Gynecology

## 2014-12-26 ENCOUNTER — Encounter: Payer: Self-pay | Admitting: Family Medicine

## 2014-12-26 ENCOUNTER — Ambulatory Visit (INDEPENDENT_AMBULATORY_CARE_PROVIDER_SITE_OTHER): Payer: BLUE CROSS/BLUE SHIELD | Admitting: Family Medicine

## 2014-12-26 VITALS — BP 131/87 | HR 84 | Ht 67.0 in | Wt 172.0 lb

## 2014-12-26 DIAGNOSIS — Z1151 Encounter for screening for human papillomavirus (HPV): Secondary | ICD-10-CM

## 2014-12-26 DIAGNOSIS — Z01419 Encounter for gynecological examination (general) (routine) without abnormal findings: Secondary | ICD-10-CM

## 2014-12-26 DIAGNOSIS — Z124 Encounter for screening for malignant neoplasm of cervix: Secondary | ICD-10-CM | POA: Diagnosis not present

## 2014-12-26 DIAGNOSIS — Z3041 Encounter for surveillance of contraceptive pills: Secondary | ICD-10-CM | POA: Diagnosis not present

## 2014-12-26 DIAGNOSIS — Z23 Encounter for immunization: Secondary | ICD-10-CM | POA: Diagnosis not present

## 2014-12-26 MED ORDER — NORETHIN ACE-ETH ESTRAD-FE 1-20 MG-MCG PO TABS: 1.0000 | ORAL_TABLET | Freq: Every day | ORAL | Status: DC

## 2014-12-26 NOTE — Patient Instructions (Signed)
Preventive Care for Adults, Female A healthy lifestyle and preventive care can promote health and wellness. Preventive health guidelines for women include the following key practices.  A routine yearly physical is a good way to check with your health care provider about your health and preventive screening. It is a chance to share any concerns and updates on your health and to receive a thorough exam.  Visit your dentist for a routine exam and preventive care every 6 months. Brush your teeth twice a day and floss once a day. Good oral hygiene prevents tooth decay and gum disease.  The frequency of eye exams is based on your age, health, family medical history, use of contact lenses, and other factors. Follow your health care provider's recommendations for frequency of eye exams.  Eat a healthy diet. Foods like vegetables, fruits, whole grains, low-fat dairy products, and lean protein foods contain the nutrients you need without too many calories. Decrease your intake of foods high in solid fats, added sugars, and salt. Eat the right amount of calories for you.Get information about a proper diet from your health care provider, if necessary.  Regular physical exercise is one of the most important things you can do for your health. Most adults should get at least 150 minutes of moderate-intensity exercise (any activity that increases your heart rate and causes you to sweat) each week. In addition, most adults need muscle-strengthening exercises on 2 or more days a week.  Maintain a healthy weight. The body mass index (BMI) is a screening tool to identify possible weight problems. It provides an estimate of body fat based on height and weight. Your health care provider can find your BMI and can help you achieve or maintain a healthy weight.For adults 20 years and older:  A BMI below 18.5 is considered underweight.  A BMI of 18.5 to 24.9 is normal.  A BMI of 25 to 29.9 is considered overweight.  A  BMI of 30 and above is considered obese.  Maintain normal blood lipids and cholesterol levels by exercising and minimizing your intake of saturated fat. Eat a balanced diet with plenty of fruit and vegetables. Blood tests for lipids and cholesterol should begin at age 45 and be repeated every 5 years. If your lipid or cholesterol levels are high, you are over 50, or you are at high risk for heart disease, you may need your cholesterol levels checked more frequently.Ongoing high lipid and cholesterol levels should be treated with medicines if diet and exercise are not working.  If you smoke, find out from your health care provider how to quit. If you do not use tobacco, do not start.  Lung cancer screening is recommended for adults aged 45-80 years who are at high risk for developing lung cancer because of a history of smoking. A yearly low-dose CT scan of the lungs is recommended for people who have at least a 30-pack-year history of smoking and are a current smoker or have quit within the past 15 years. A pack year of smoking is smoking an average of 1 pack of cigarettes a day for 1 year (for example: 1 pack a day for 30 years or 2 packs a day for 15 years). Yearly screening should continue until the smoker has stopped smoking for at least 15 years. Yearly screening should be stopped for people who develop a health problem that would prevent them from having lung cancer treatment.  If you are pregnant, do not drink alcohol. If you are  breastfeeding, be very cautious about drinking alcohol. If you are not pregnant and choose to drink alcohol, do not have more than 1 drink per day. One drink is considered to be 12 ounces (355 mL) of beer, 5 ounces (148 mL) of wine, or 1.5 ounces (44 mL) of liquor.  Avoid use of street drugs. Do not share needles with anyone. Ask for help if you need support or instructions about stopping the use of drugs.  High blood pressure causes heart disease and increases the risk  of stroke. Your blood pressure should be checked at least every 1 to 2 years. Ongoing high blood pressure should be treated with medicines if weight loss and exercise do not work.  If you are 55-79 years old, ask your health care provider if you should take aspirin to prevent strokes.  Diabetes screening is done by taking a blood sample to check your blood glucose level after you have not eaten for a certain period of time (fasting). If you are not overweight and you do not have risk factors for diabetes, you should be screened once every 3 years starting at age 45. If you are overweight or obese and you are 40-70 years of age, you should be screened for diabetes every year as part of your cardiovascular risk assessment.  Breast cancer screening is essential preventive care for women. You should practice "breast self-awareness." This means understanding the normal appearance and feel of your breasts and may include breast self-examination. Any changes detected, no matter how small, should be reported to a health care provider. Women in their 20s and 30s should have a clinical breast exam (CBE) by a health care provider as part of a regular health exam every 1 to 3 years. After age 40, women should have a CBE every year. Starting at age 40, women should consider having a mammogram (breast X-ray test) every year. Women who have a family history of breast cancer should talk to their health care provider about genetic screening. Women at a high risk of breast cancer should talk to their health care providers about having an MRI and a mammogram every year.  Breast cancer gene (BRCA)-related cancer risk assessment is recommended for women who have family members with BRCA-related cancers. BRCA-related cancers include breast, ovarian, tubal, and peritoneal cancers. Having family members with these cancers may be associated with an increased risk for harmful changes (mutations) in the breast cancer genes BRCA1 and  BRCA2. Results of the assessment will determine the need for genetic counseling and BRCA1 and BRCA2 testing.  Your health care provider may recommend that you be screened regularly for cancer of the pelvic organs (ovaries, uterus, and vagina). This screening involves a pelvic examination, including checking for microscopic changes to the surface of your cervix (Pap test). You may be encouraged to have this screening done every 3 years, beginning at age 21.  For women ages 30-65, health care providers may recommend pelvic exams and Pap testing every 3 years, or they may recommend the Pap and pelvic exam, combined with testing for human papilloma virus (HPV), every 5 years. Some types of HPV increase your risk of cervical cancer. Testing for HPV may also be done on women of any age with unclear Pap test results.  Other health care providers may not recommend any screening for nonpregnant women who are considered low risk for pelvic cancer and who do not have symptoms. Ask your health care provider if a screening pelvic exam is right for   you.  If you have had past treatment for cervical cancer or a condition that could lead to cancer, you need Pap tests and screening for cancer for at least 20 years after your treatment. If Pap tests have been discontinued, your risk factors (such as having a new sexual partner) need to be reassessed to determine if screening should resume. Some women have medical problems that increase the chance of getting cervical cancer. In these cases, your health care provider may recommend more frequent screening and Pap tests.  Colorectal cancer can be detected and often prevented. Most routine colorectal cancer screening begins at the age of 50 years and continues through age 75 years. However, your health care provider may recommend screening at an earlier age if you have risk factors for colon cancer. On a yearly basis, your health care provider may provide home test kits to check  for hidden blood in the stool. Use of a small camera at the end of a tube, to directly examine the colon (sigmoidoscopy or colonoscopy), can detect the earliest forms of colorectal cancer. Talk to your health care provider about this at age 50, when routine screening begins. Direct exam of the colon should be repeated every 5-10 years through age 75 years, unless early forms of precancerous polyps or small growths are found.  People who are at an increased risk for hepatitis B should be screened for this virus. You are considered at high risk for hepatitis B if:  You were born in a country where hepatitis B occurs often. Talk with your health care provider about which countries are considered high risk.  Your parents were born in a high-risk country and you have not received a shot to protect against hepatitis B (hepatitis B vaccine).  You have HIV or AIDS.  You use needles to inject street drugs.  You live with, or have sex with, someone who has hepatitis B.  You get hemodialysis treatment.  You take certain medicines for conditions like cancer, organ transplantation, and autoimmune conditions.  Hepatitis C blood testing is recommended for all people born from 1945 through 1965 and any individual with known risks for hepatitis C.  Practice safe sex. Use condoms and avoid high-risk sexual practices to reduce the spread of sexually transmitted infections (STIs). STIs include gonorrhea, chlamydia, syphilis, trichomonas, herpes, HPV, and human immunodeficiency virus (HIV). Herpes, HIV, and HPV are viral illnesses that have no cure. They can result in disability, cancer, and death.  You should be screened for sexually transmitted illnesses (STIs) including gonorrhea and chlamydia if:  You are sexually active and are younger than 24 years.  You are older than 24 years and your health care provider tells you that you are at risk for this type of infection.  Your sexual activity has changed  since you were last screened and you are at an increased risk for chlamydia or gonorrhea. Ask your health care provider if you are at risk.  If you are at risk of being infected with HIV, it is recommended that you take a prescription medicine daily to prevent HIV infection. This is called preexposure prophylaxis (PrEP). You are considered at risk if:  You are sexually active and do not regularly use condoms or know the HIV status of your partner(s).  You take drugs by injection.  You are sexually active with a partner who has HIV.  Talk with your health care provider about whether you are at high risk of being infected with HIV. If   you choose to begin PrEP, you should first be tested for HIV. You should then be tested every 3 months for as long as you are taking PrEP.  Osteoporosis is a disease in which the bones lose minerals and strength with aging. This can result in serious bone fractures or breaks. The risk of osteoporosis can be identified using a bone density scan. Women ages 67 years and over and women at risk for fractures or osteoporosis should discuss screening with their health care providers. Ask your health care provider whether you should take a calcium supplement or vitamin D to reduce the rate of osteoporosis.  Menopause can be associated with physical symptoms and risks. Hormone replacement therapy is available to decrease symptoms and risks. You should talk to your health care provider about whether hormone replacement therapy is right for you.  Use sunscreen. Apply sunscreen liberally and repeatedly throughout the day. You should seek shade when your shadow is shorter than you. Protect yourself by wearing long sleeves, pants, a wide-brimmed hat, and sunglasses year round, whenever you are outdoors.  Once a month, do a whole body skin exam, using a mirror to look at the skin on your back. Tell your health care provider of new moles, moles that have irregular borders, moles that  are larger than a pencil eraser, or moles that have changed in shape or color.  Stay current with required vaccines (immunizations).  Influenza vaccine. All adults should be immunized every year.  Tetanus, diphtheria, and acellular pertussis (Td, Tdap) vaccine. Pregnant women should receive 1 dose of Tdap vaccine during each pregnancy. The dose should be obtained regardless of the length of time since the last dose. Immunization is preferred during the 27th-36th week of gestation. An adult who has not previously received Tdap or who does not know her vaccine status should receive 1 dose of Tdap. This initial dose should be followed by tetanus and diphtheria toxoids (Td) booster doses every 10 years. Adults with an unknown or incomplete history of completing a 3-dose immunization series with Td-containing vaccines should begin or complete a primary immunization series including a Tdap dose. Adults should receive a Td booster every 10 years.  Varicella vaccine. An adult without evidence of immunity to varicella should receive 2 doses or a second dose if she has previously received 1 dose. Pregnant females who do not have evidence of immunity should receive the first dose after pregnancy. This first dose should be obtained before leaving the health care facility. The second dose should be obtained 4-8 weeks after the first dose.  Human papillomavirus (HPV) vaccine. Females aged 13-26 years who have not received the vaccine previously should obtain the 3-dose series. The vaccine is not recommended for use in pregnant females. However, pregnancy testing is not needed before receiving a dose. If a female is found to be pregnant after receiving a dose, no treatment is needed. In that case, the remaining doses should be delayed until after the pregnancy. Immunization is recommended for any person with an immunocompromised condition through the age of 61 years if she did not get any or all doses earlier. During the  3-dose series, the second dose should be obtained 4-8 weeks after the first dose. The third dose should be obtained 24 weeks after the first dose and 16 weeks after the second dose.  Zoster vaccine. One dose is recommended for adults aged 30 years or older unless certain conditions are present.  Measles, mumps, and rubella (MMR) vaccine. Adults born  before 1957 generally are considered immune to measles and mumps. Adults born in 1957 or later should have 1 or more doses of MMR vaccine unless there is a contraindication to the vaccine or there is laboratory evidence of immunity to each of the three diseases. A routine second dose of MMR vaccine should be obtained at least 28 days after the first dose for students attending postsecondary schools, health care workers, or international travelers. People who received inactivated measles vaccine or an unknown type of measles vaccine during 1963-1967 should receive 2 doses of MMR vaccine. People who received inactivated mumps vaccine or an unknown type of mumps vaccine before 1979 and are at high risk for mumps infection should consider immunization with 2 doses of MMR vaccine. For females of childbearing age, rubella immunity should be determined. If there is no evidence of immunity, females who are not pregnant should be vaccinated. If there is no evidence of immunity, females who are pregnant should delay immunization until after pregnancy. Unvaccinated health care workers born before 1957 who lack laboratory evidence of measles, mumps, or rubella immunity or laboratory confirmation of disease should consider measles and mumps immunization with 2 doses of MMR vaccine or rubella immunization with 1 dose of MMR vaccine.  Pneumococcal 13-valent conjugate (PCV13) vaccine. When indicated, a person who is uncertain of his immunization history and has no record of immunization should receive the PCV13 vaccine. All adults 65 years of age and older should receive this  vaccine. An adult aged 19 years or older who has certain medical conditions and has not been previously immunized should receive 1 dose of PCV13 vaccine. This PCV13 should be followed with a dose of pneumococcal polysaccharide (PPSV23) vaccine. Adults who are at high risk for pneumococcal disease should obtain the PPSV23 vaccine at least 8 weeks after the dose of PCV13 vaccine. Adults older than 30 years of age who have normal immune system function should obtain the PPSV23 vaccine dose at least 1 year after the dose of PCV13 vaccine.  Pneumococcal polysaccharide (PPSV23) vaccine. When PCV13 is also indicated, PCV13 should be obtained first. All adults aged 65 years and older should be immunized. An adult younger than age 65 years who has certain medical conditions should be immunized. Any person who resides in a nursing home or long-term care facility should be immunized. An adult smoker should be immunized. People with an immunocompromised condition and certain other conditions should receive both PCV13 and PPSV23 vaccines. People with human immunodeficiency virus (HIV) infection should be immunized as soon as possible after diagnosis. Immunization during chemotherapy or radiation therapy should be avoided. Routine use of PPSV23 vaccine is not recommended for American Indians, [place] Natives, or people younger than 65 years unless there are medical conditions that require PPSV23 vaccine. When indicated, people who have unknown immunization and have no record of immunization should receive PPSV23 vaccine. One-time revaccination 5 years after the first dose of PPSV23 is recommended for people aged 19-64 years who have chronic kidney failure, nephrotic syndrome, asplenia, or immunocompromised conditions. People who received 1-2 doses of PPSV23 before age 65 years should receive another dose of PPSV23 vaccine at age 65 years or later if at least 5 years have passed since the previous dose. Doses of PPSV23 are not  needed for people immunized with PPSV23 at or after age 65 years.  Meningococcal vaccine. Adults with asplenia or persistent complement component deficiencies should receive 2 doses of quadrivalent meningococcal conjugate (MenACWY-D) vaccine. The doses should be obtained   at least 2 months apart. Microbiologists working with certain meningococcal bacteria, [place] recruits, people at risk during an outbreak, and people who travel to or live in countries with a high rate of meningitis should be immunized. A first-year college student up through age 34 years who is living in a residence hall should receive a dose if she did not receive a dose on or after her 16th birthday. Adults who have certain high-risk conditions should receive one or more doses of vaccine.  Hepatitis A vaccine. Adults who wish to be protected from this disease, have certain high-risk conditions, work with hepatitis A-infected animals, work in hepatitis A research labs, or travel to or work in countries with a high rate of hepatitis A should be immunized. Adults who were previously unvaccinated and who anticipate close contact with an international adoptee during the first 60 days after arrival in the [place] from a country with a high rate of hepatitis A should be immunized.  Hepatitis B vaccine. Adults who wish to be protected from this disease, have certain high-risk conditions, may be exposed to blood or other infectious body fluids, are household contacts or sex partners of hepatitis B positive people, are clients or workers in certain care facilities, or travel to or work in countries with a high rate of hepatitis B should be immunized.  Haemophilus influenzae type b (Hib) vaccine. A previously unvaccinated person with asplenia or sickle cell disease or having a scheduled splenectomy should receive 1 dose of Hib vaccine. Regardless of previous immunization, a recipient of a hematopoietic stem cell transplant should receive a  3-dose series 6-12 months after her successful transplant. Hib vaccine is not recommended for adults with HIV infection. Preventive Services / Frequency Ages 35 to 4 years  Blood pressure check.** / Every 3-5 years.  Lipid and cholesterol check.** / Every 5 years beginning at age 60.  Clinical breast exam.** / Every 3 years for women in their 71s and 10s.  BRCA-related cancer risk assessment.** / For women who have family members with a BRCA-related cancer (breast, ovarian, tubal, or peritoneal cancers).  Pap test.** / Every 2 years from ages 76 through 26. Every 3 years starting at age 61 through age 76 or 93 with a history of 3 consecutive normal Pap tests.  HPV screening.** / Every 3 years from ages 37 through ages 60 to 51 with a history of 3 consecutive normal Pap tests.  Hepatitis C blood test.** / For any individual with known risks for hepatitis C.  Skin self-exam. / Monthly.  Influenza vaccine. / Every year.  Tetanus, diphtheria, and acellular pertussis (Tdap, Td) vaccine.** / Consult your health care provider. Pregnant women should receive 1 dose of Tdap vaccine during each pregnancy. 1 dose of Td every 10 years.  Varicella vaccine.** / Consult your health care provider. Pregnant females who do not have evidence of immunity should receive the first dose after pregnancy.  HPV vaccine. / 3 doses over 6 months, if 93 and younger. The vaccine is not recommended for use in pregnant females. However, pregnancy testing is not needed before receiving a dose.  Measles, mumps, rubella (MMR) vaccine.** / You need at least 1 dose of MMR if you were born in 1957 or later. You may also need a 2nd dose. For females of childbearing age, rubella immunity should be determined. If there is no evidence of immunity, females who are not pregnant should be vaccinated. If there is no evidence of immunity, females who are  pregnant should delay immunization until after pregnancy.  Pneumococcal  13-valent conjugate (PCV13) vaccine.** / Consult your health care provider.  Pneumococcal polysaccharide (PPSV23) vaccine.** / 1 to 2 doses if you smoke cigarettes or if you have certain conditions.  Meningococcal vaccine.** / 1 dose if you are age 68 to 8 years and a Market researcher living in a residence hall, or have one of several medical conditions, you need to get vaccinated against meningococcal disease. You may also need additional booster doses.  Hepatitis A vaccine.** / Consult your health care provider.  Hepatitis B vaccine.** / Consult your health care provider.  Haemophilus influenzae type b (Hib) vaccine.** / Consult your health care provider. Ages 7 to 53 years  Blood pressure check.** / Every year.  Lipid and cholesterol check.** / Every 5 years beginning at age 25 years.  Lung cancer screening. / Every year if you are aged 11-80 years and have a 30-pack-year history of smoking and currently smoke or have quit within the past 15 years. Yearly screening is stopped once you have quit smoking for at least 15 years or develop a health problem that would prevent you from having lung cancer treatment.  Clinical breast exam.** / Every year after age 48 years.  BRCA-related cancer risk assessment.** / For women who have family members with a BRCA-related cancer (breast, ovarian, tubal, or peritoneal cancers).  Mammogram.** / Every year beginning at age 41 years and continuing for as long as you are in good health. Consult with your health care provider.  Pap test.** / Every 3 years starting at age 65 years through age 37 or 70 years with a history of 3 consecutive normal Pap tests.  HPV screening.** / Every 3 years from ages 72 years through ages 60 to 40 years with a history of 3 consecutive normal Pap tests.  Fecal occult blood test (FOBT) of stool. / Every year beginning at age 21 years and continuing until age 5 years. You may not need to do this test if you get  a colonoscopy every 10 years.  Flexible sigmoidoscopy or colonoscopy.** / Every 5 years for a flexible sigmoidoscopy or every 10 years for a colonoscopy beginning at age 35 years and continuing until age 48 years.  Hepatitis C blood test.** / For all people born from [birth year] through 1965 and any individual with known risks for hepatitis C.  Skin self-exam. / Monthly.  Influenza vaccine. / Every year.  Tetanus, diphtheria, and acellular pertussis (Tdap/Td) vaccine.** / Consult your health care provider. Pregnant women should receive 1 dose of Tdap vaccine during each pregnancy. 1 dose of Td every 10 years.  Varicella vaccine.** / Consult your health care provider. Pregnant females who do not have evidence of immunity should receive the first dose after pregnancy.  Zoster vaccine.** / 1 dose for adults aged 30 years or older.  Measles, mumps, rubella (MMR) vaccine.** / You need at least 1 dose of MMR if you were born in 1957 or later. You may also need a second dose. For females of childbearing age, rubella immunity should be determined. If there is no evidence of immunity, females who are not pregnant should be vaccinated. If there is no evidence of immunity, females who are pregnant should delay immunization until after pregnancy.  Pneumococcal 13-valent conjugate (PCV13) vaccine.** / Consult your health care provider.  Pneumococcal polysaccharide (PPSV23) vaccine.** / 1 to 2 doses if you smoke cigarettes or if you have certain conditions.  Meningococcal vaccine.** /  Consult your health care provider.  Hepatitis A vaccine.** / Consult your health care provider.  Hepatitis B vaccine.** / Consult your health care provider.  Haemophilus influenzae type b (Hib) vaccine.** / Consult your health care provider. Ages 64 years and over  Blood pressure check.** / Every year.  Lipid and cholesterol check.** / Every 5 years beginning at age 23 years.  Lung cancer screening. / Every year if you  are aged 16-80 years and have a 30-pack-year history of smoking and currently smoke or have quit within the past 15 years. Yearly screening is stopped once you have quit smoking for at least 15 years or develop a health problem that would prevent you from having lung cancer treatment.  Clinical breast exam.** / Every year after age 74 years.  BRCA-related cancer risk assessment.** / For women who have family members with a BRCA-related cancer (breast, ovarian, tubal, or peritoneal cancers).  Mammogram.** / Every year beginning at age 44 years and continuing for as long as you are in good health. Consult with your health care provider.  Pap test.** / Every 3 years starting at age 58 years through age 22 or 39 years with 3 consecutive normal Pap tests. Testing can be stopped between 65 and 70 years with 3 consecutive normal Pap tests and no abnormal Pap or HPV tests in the past 10 years.  HPV screening.** / Every 3 years from ages 64 years through ages 70 or 61 years with a history of 3 consecutive normal Pap tests. Testing can be stopped between 65 and 70 years with 3 consecutive normal Pap tests and no abnormal Pap or HPV tests in the past 10 years.  Fecal occult blood test (FOBT) of stool. / Every year beginning at age 40 years and continuing until age 27 years. You may not need to do this test if you get a colonoscopy every 10 years.  Flexible sigmoidoscopy or colonoscopy.** / Every 5 years for a flexible sigmoidoscopy or every 10 years for a colonoscopy beginning at age 7 years and continuing until age 32 years.  Hepatitis C blood test.** / For all people born from [birth year] through 1966 and any individual with known risks for hepatitis C.  Osteoporosis screening.** / A one-time screening for women ages 30 years and over and women at risk for fractures or osteoporosis.  Skin self-exam. / Monthly.  Influenza vaccine. / Every year.  Tetanus, diphtheria, and acellular pertussis (Tdap/Td)  vaccine.** / 1 dose of Td every 10 years.  Varicella vaccine.** / Consult your health care provider.  Zoster vaccine.** / 1 dose for adults aged 35 years or older.  Pneumococcal 13-valent conjugate (PCV13) vaccine.** / Consult your health care provider.  Pneumococcal polysaccharide (PPSV23) vaccine.** / 1 dose for all adults aged 46 years and older.  Meningococcal vaccine.** / Consult your health care provider.  Hepatitis A vaccine.** / Consult your health care provider.  Hepatitis B vaccine.** / Consult your health care provider.  Haemophilus influenzae type b (Hib) vaccine.** / Consult your health care provider. ** Family history and personal history of risk and conditions may change your health care provider's recommendations.   This information is not intended to replace advice given to you by your health care provider. Make sure you discuss any questions you have with your health care provider.   Document Released: 03/29/2001 Document Revised: 02/21/2014 Document Reviewed: 06/28/2010 Elsevier Interactive Patient Education [employer].

## 2014-12-26 NOTE — Progress Notes (Signed)
  Subjective:     Anne Phelps is a 29 y.o. female and is here for a comprehensive physical exam. The patient reports no problems. 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 She does not want more kids at this time.  She has issues with OC's if she skips a pill this leads to bleeding. If she takes them regularly, then there is no issue.  Social History   Social History  . Marital Status: Single    Spouse Name: N/A  . Number of Children: N/A  . Years of Education: N/A   Occupational History  . Not on file.   Social History Main Topics  . Smoking status: Never Smoker   . Smokeless tobacco: Never Used  . Alcohol Use: No  . Drug Use: No  . Sexual Activity: Yes   Other Topics Concern  . Not on file   Social History Narrative   Health Maintenance  Topic Date Due  . INFLUENZA VACCINE  09/15/2014  . PAP SMEAR  03/27/2016  . TETANUS/TDAP  08/15/2023  . HIV Screening  Completed    The following portions of the patient's history were reviewed and updated as appropriate: allergies, current medications, past family history, past medical history, past social history, past surgical history and problem list.  Review of Systems Pertinent items noted in HPI and remainder of comprehensive ROS otherwise negative.   Objective:    BP 131/87 mmHg  Pulse 84  Ht 5\' 7"  (1.702 m)  Wt 172 lb (78.019 kg)  BMI 26.93 kg/m2  LMP 12/09/2014  Breastfeeding? No General appearance: alert, cooperative and appears stated age Head: Normocephalic, without obvious abnormality, atraumatic Neck: no adenopathy, supple, symmetrical, trachea midline and thyroid not enlarged, symmetric, no tenderness/mass/nodules Lungs: clear to auscultation bilaterally Breasts: normal appearance, no masses or tenderness Heart: regular rate and rhythm, S1, S2 normal, no murmur, click, rub or gallop Abdomen: soft, non-tender; bowel sounds normal; no masses,  no organomegaly Pelvic: cervix normal in appearance, external  genitalia normal, no adnexal masses or tenderness, no cervical motion tenderness, uterus normal size, shape, and consistency and vagina normal without discharge Extremities: extremities normal, atraumatic, no cyanosis or edema Pulses: 2+ and symmetric Skin: Skin color, texture, turgor normal. No rashes or lesions Lymph nodes: Cervical, supraclavicular, and axillary nodes normal. Neurologic: Grossly normal    Assessment:    Healthy female exam.      Plan:   Problem List Items Addressed This Visit    None    Visit Diagnoses    Encounter for routine gynecological examination    -  Primary    Relevant Orders    CBC    Comprehensive metabolic panel    TSH    Lipid panel    Immunization due        Relevant Orders    Flu Vaccine QUAD 36+ mos IM (Fluarix, Quad PF) (Completed)    Encounter for surveillance of contraceptive pills        Relevant Medications    norethindrone-ethinyl estradiol (JUNEL FE 1/20) 1-20 MG-MCG tablet    Screening for malignant neoplasm of cervix        Relevant Orders    Cytology - PAP    Encounter for surveillance of contraceptive pills [Z30.41]        Relevant Medications    norethindrone-ethinyl estradiol (JUNEL FE 1/20) 1-20 MG-MCG tablet         See After Visit Summary for Counseling Recommendations

## 2014-12-27 LAB — COMPREHENSIVE METABOLIC PANEL
ALK PHOS: 76 U/L (ref 33–115)
ALT: 10 U/L (ref 6–29)
AST: 13 U/L (ref 10–30)
Albumin: 4.1 g/dL (ref 3.6–5.1)
BUN: 9 mg/dL (ref 7–25)
CALCIUM: 9.2 mg/dL (ref 8.6–10.2)
CHLORIDE: 106 mmol/L (ref 98–110)
CO2: 25 mmol/L (ref 20–31)
Creat: 0.7 mg/dL (ref 0.50–1.10)
Glucose, Bld: 76 mg/dL (ref 65–99)
Potassium: 4.3 mmol/L (ref 3.5–5.3)
Sodium: 143 mmol/L (ref 135–146)
TOTAL PROTEIN: 7 g/dL (ref 6.1–8.1)
Total Bilirubin: 0.4 mg/dL (ref 0.2–1.2)

## 2014-12-27 LAB — LIPID PANEL
CHOL/HDL RATIO: 3.5 ratio (ref <=5.0)
Cholesterol: 181 mg/dL (ref 125–200)
HDL: 52 mg/dL (ref >=46)
LDL Cholesterol: 105 mg/dL (ref <130)
TRIGLYCERIDES: 121 mg/dL (ref <150)
VLDL: 24 mg/dL (ref <30)

## 2014-12-27 LAB — CBC
HEMATOCRIT: 37.8 % (ref 36.0–46.0)
Hemoglobin: 12.7 g/dL (ref 12.0–15.0)
MCH: 27.7 pg (ref 26.0–34.0)
MCHC: 33.6 g/dL (ref 30.0–36.0)
MCV: 82.5 fL (ref 78.0–100.0)
MPV: 9.5 fL (ref 8.6–12.4)
Platelets: 276 10*3/uL (ref 150–400)
RBC: 4.58 MIL/uL (ref 3.87–5.11)
RDW: 13.3 % (ref 11.5–15.5)
WBC: 8.9 10*3/uL (ref 4.0–10.5)

## 2014-12-27 LAB — TSH: TSH: 3.295 u[IU]/mL (ref 0.350–4.500)

## 2014-12-29 LAB — CYTOLOGY - PAP

## 2015-04-11 ENCOUNTER — Encounter: Payer: Self-pay | Admitting: Physician Assistant

## 2015-04-11 ENCOUNTER — Ambulatory Visit (INDEPENDENT_AMBULATORY_CARE_PROVIDER_SITE_OTHER): Payer: BLUE CROSS/BLUE SHIELD | Admitting: Physician Assistant

## 2015-04-11 VITALS — BP 118/68 | HR 76 | Temp 98.6°F | Resp 12 | Ht 66.0 in | Wt 157.0 lb

## 2015-04-11 DIAGNOSIS — Z0000 Encounter for general adult medical examination without abnormal findings: Secondary | ICD-10-CM | POA: Diagnosis not present

## 2015-04-11 DIAGNOSIS — Z6825 Body mass index (BMI) 25.0-25.9, adult: Secondary | ICD-10-CM

## 2015-04-11 NOTE — Patient Instructions (Signed)

## 2015-04-11 NOTE — Progress Notes (Signed)
Patient ID: Anne Phelps, female    DOB: 06-Aug-1985, 30 y.o.   MRN: [id number]  PCP: No primary care provider on file.  Chief Complaint  Patient presents with  . 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 In addition, she needs a form completed for work, documenting that she is health and fit for the position. Breast and GYN care with GYN 12/31/2014.   Patient Active Problem List   Diagnosis Date Noted  . Autoimmune disorder (HCC) 03/27/2013    Past Medical History  Diagnosis Date  . Autoimmune disorder (HCC)   . Postpartum depression 11/29/2013     Prior to Admission medications   Medication Sig Start Date End Date Taking? Authorizing Provider  ibuprofen (ADVIL,MOTRIN) 600 MG tablet Take 1 tablet (600 mg total) by mouth every 6 (six) hours. 11/05/13  Yes Ethelda Chick, MD  norethindrone-ethinyl estradiol (JUNEL FE 1/20) 1-20 MG-MCG tablet Take 1 tablet by mouth daily. 12/26/14  Yes Reva Bores, MD    Allergies  Allergen Reactions  . Penicillins Swelling and Rash    Throat swells    Past Surgical History  Procedure Laterality Date  . No past surgeries      Family History  Problem Relation Age of Onset  . Diabetes Maternal Grandmother   . Hypertension Maternal Grandmother   . Stroke Maternal Grandmother   . Heart attack Father   . Hypertension Father   . Hypertension Mother   . Hypercholesterolemia Mother   . Psoriasis Mother   . Breast cancer Cousin 30  . Hypertension Maternal Grandfather   . Hyperlipidemia Maternal Grandfather   . Hyperlipidemia Paternal Grandfather   . Hypertension Paternal Grandfather   . Heart disease Paternal Grandfather   . Stroke Paternal Grandfather     Social History   Social History  . Marital Status: Married    Spouse Name: Ashana Tullo  . Number of Children: 1  . 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 Smoking status: Never Smoker   . Smokeless tobacco: Never Used  . Alcohol Use: No  . Drug Use: No  . Sexual Activity:    Partners: Male    Birth Control/ Protection: Pill   Other Topics Concern  . None   Social History Narrative   Lives with her husband and their daughter.   Husband travels frequently for work.   Parents and in-laws live locally.       Review of Systems  Constitutional: Negative.   HENT: Negative.   Eyes: Negative.   Respiratory: Negative.   Cardiovascular: Negative.   Gastrointestinal: Negative.   Genitourinary: Negative.   Musculoskeletal: Negative.   Skin: Negative.   Neurological: Negative.   Psychiatric/Behavioral: Negative.         Objective:  Physical Exam  Constitutional: She is oriented to person, place, and time. Vital signs are normal. She appears well-developed and well-nourished. She is active and cooperative. No distress.  BP 118/68 mmHg  Pulse 76  Temp(Src) 98.6 F (37 C) (Oral)  Resp 12  Ht  (1.676 m)  Wt 157 lb (71.215 kg)  BMI 25.35 kg/m2  SpO2 99%  LMP 03/27/2015   HENT:  Head: Normocephalic and atraumatic.  Right Ear: Hearing, tympanic membrane, external ear and ear canal normal. No foreign bodies.  Left Ear: Hearing, tympanic  membrane, external ear and ear canal normal. No foreign bodies.  Nose: Nose normal.  Mouth/Throat: Uvula is midline, oropharynx is clear and moist and mucous membranes are normal. No oral lesions. Normal dentition. No dental abscesses or uvula swelling. No oropharyngeal exudate.  Eyes: Conjunctivae, EOM and lids are normal. Pupils are equal, round, and reactive to light. Right eye exhibits no discharge. Left eye exhibits no discharge. No scleral icterus.  Fundoscopic exam:      The right eye shows no arteriolar narrowing, no AV nicking, no exudate, no hemorrhage and no papilledema. The right eye shows red reflex.       The left eye shows no arteriolar narrowing, no AV  nicking, no exudate, no hemorrhage and no papilledema. The left eye shows red reflex.  Neck: Trachea normal, normal range of motion and full passive range of motion without pain. Neck supple. No spinous process tenderness and no muscular tenderness present. No thyroid mass and no thyromegaly present.  Cardiovascular: Normal rate, regular rhythm, normal heart sounds, intact distal pulses and normal pulses.   Pulmonary/Chest: Effort normal and breath sounds normal.  Musculoskeletal: She exhibits no edema or tenderness.       Cervical back: Normal.       Thoracic back: Normal.       Lumbar back: Normal.  Lymphadenopathy:       Head (right side): No tonsillar, no preauricular, no posterior auricular and no occipital adenopathy present.       Head (left side): No tonsillar, no preauricular, no posterior auricular and no occipital adenopathy present.    She has no cervical adenopathy.       Right: No supraclavicular adenopathy present.       Left: No supraclavicular adenopathy present.  Neurological: She is alert and oriented to person, place, and time. She has normal strength and normal reflexes. No cranial nerve deficit. She exhibits normal muscle tone. Coordination and gait normal.  Skin: Skin is warm, dry and intact. No rash noted. She is not diaphoretic. No cyanosis or erythema. Nails show no clubbing.  Psychiatric: She has a normal mood and affect. Her speech is normal and behavior is normal. Judgment and thought content normal.           Assessment & Plan:  1. Annual physical exam Age appropriate anticipatory guidance provided.  2. BMI 25.0-25.9,adult Healthy eating, regular exercise for risk reduction.   Fernande Bras, PA-C Physician Assistant-Certified [facility]

## 2015-11-18 ENCOUNTER — Telehealth: Payer: Self-pay | Admitting: *Deleted

## 2015-11-18 DIAGNOSIS — Z3041 Encounter for surveillance of contraceptive pills: Secondary | ICD-10-CM

## 2015-11-18 MED ORDER — NORETHIN ACE-ETH ESTRAD-FE 1-20 MG-MCG PO TABS: 1.0000 | ORAL_TABLET | Freq: Every day | ORAL | 6 refills | Status: DC

## 2015-11-18 NOTE — Telephone Encounter (Signed)
Sent refill for birth control to pharmacy.

## 2016-02-05 ENCOUNTER — Ambulatory Visit: Payer: BLUE CROSS/BLUE SHIELD | Admitting: Obstetrics and Gynecology

## 2016-02-08 IMAGING — US US OB COMP +14 WK
1 series · 12 of 28 positions shown · non-contrast
Comparison: none

OBSTETRICS REPORT
                      (Signed Final 06/14/2013 [DATE])

Service(s) Provided
 US OB COMP + 14 WK                                    76805.1
Indications
 Basic anatomic survey
Fetal Evaluation
 Num Of Fetuses:    1
 Fetal Heart Rate:  150                          bpm
 Cardiac Activity:  Observed
 Presentation:      Variable
 Placenta:          Posterior, above cervical
                    os
 P. Cord            Visualized, central
 Insertion:
 Amniotic Fluid
 AFI FV:      Subjectively within normal limits
                                             Larg Pckt:     6.3  cm
Biometry
 BPD:     44.6  mm     G. Age:  19w 3d                CI:        73.49   70 - 86
                                                      FL/HC:      18.4   16.1 -
 HC:     165.3  mm     G. Age:  19w 2d       34  %    HC/AC:      1.11   1.09 -
 AC:     148.6  mm     G. Age:  20w 1d       68  %    FL/BPD:
 FL:      30.4  mm     G. Age:  19w 3d       42  %    FL/AC:      20.5   20 - 24
 HUM:     28.9  mm     G. Age:  19w 3d       50  %
 CER:       19  mm     G. Age:  18w 4d       25  %
 NFT:     1.94  mm
 Est. FW:     309  gm    0 lb 11 oz      52  %
Gestational Age
 LMP:           22w 4d        Date:  01/07/13                 EDD:   10/14/13
 U/S Today:     19w 4d                                        EDD:   11/04/13
 Best:          19w 3d     Det. By:  Early Ultrasound         EDD:   11/05/13
                                     (03/27/13)
Anatomy
 Cranium:          Appears normal         Aortic Arch:      Not well visualized
 Fetal Cavum:      Appears normal         Ductal Arch:      Not well visualized
 Ventricles:       Appears normal         Diaphragm:        Not well visualized
 Choroid Plexus:   Appears normal         Stomach:          Not well visualized
 Cerebellum:       Appears normal         Abdomen:          Appears normal
 Posterior Fossa:  Appears normal         Abdominal Wall:   Appears nml (cord
                                                            insert, abd wall)
 Nuchal Fold:      Appears normal         Cord Vessels:     Not well visualized
 Face:             Orbits appear          Kidneys:          Appear normal
                   normal
 Lips:             Appears normal         Bladder:          Appears normal
 Heart:            Not well visualized    Spine:            Not well visualized
 RVOT:             Not well visualized    Lower             Appears normal
                                          Extremities:
 LVOT:             Appears normal         Upper             Appears normal
 Other:  Fetus appears to be a female. Heels visualized.
Targeted Anatomy
 Fetal Central Nervous System
 Lat. Ventricles:  5.5                    Cisterna Magna:
Cervix Uterus Adnexa
 Cervical Length:    3.21     cm
 Cervix:       Normal appearance by transabdominal scan.
 Uterus:       No abnormality visualized.
 Left Ovary:    Not visualized.
 Right Ovary:   Size(cm) L: 4.76 x W: 2.72 x H: 2.2  Volume(cc):
 Adnexa:     No abnormality visualized. No adnexal mass visualized.
Impression
INDICATION: 27 yr old G1P0 at 21w1d for fetal anatomic survey.
 Remote read.

[Series 1: us ob comp +14 wk · 89 acquisitions, 12 frames shown]
[im 4/89]
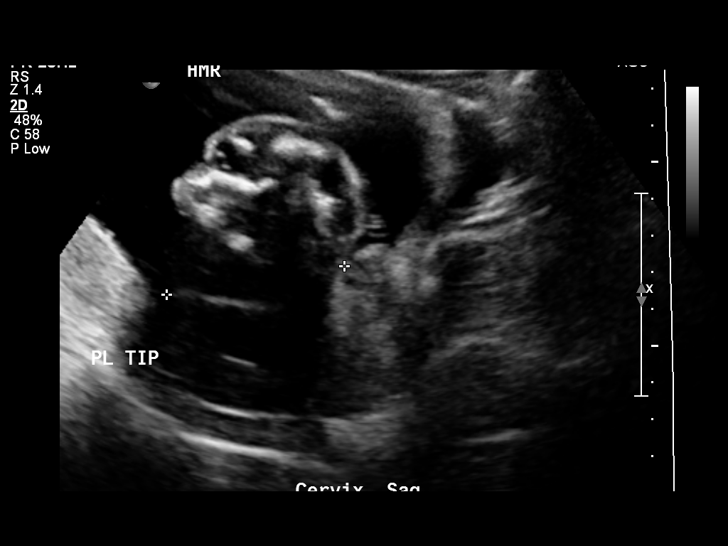
[im 10/89]
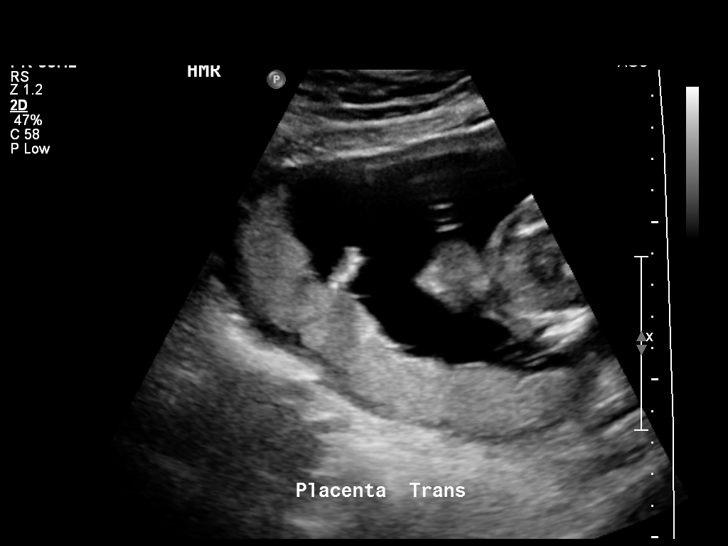
[im 17/89]
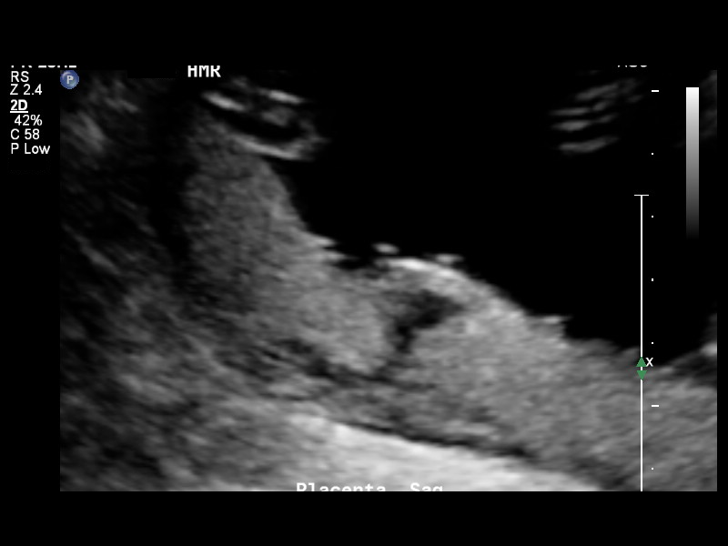
[im 27/89]
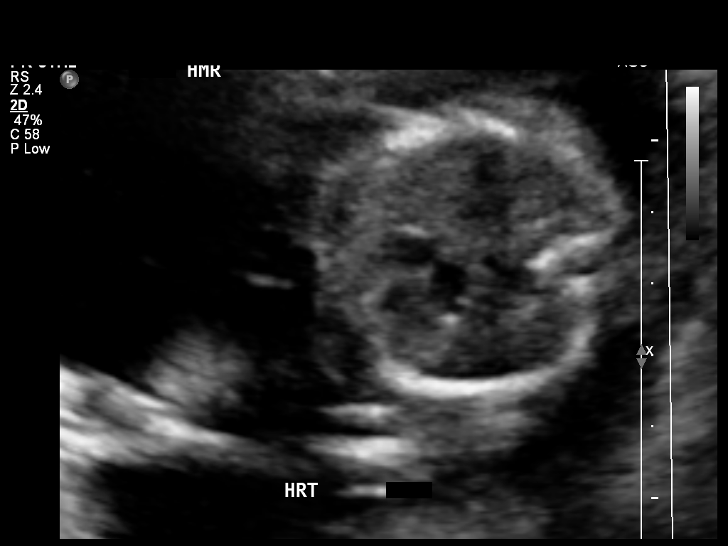
[im 33/89]
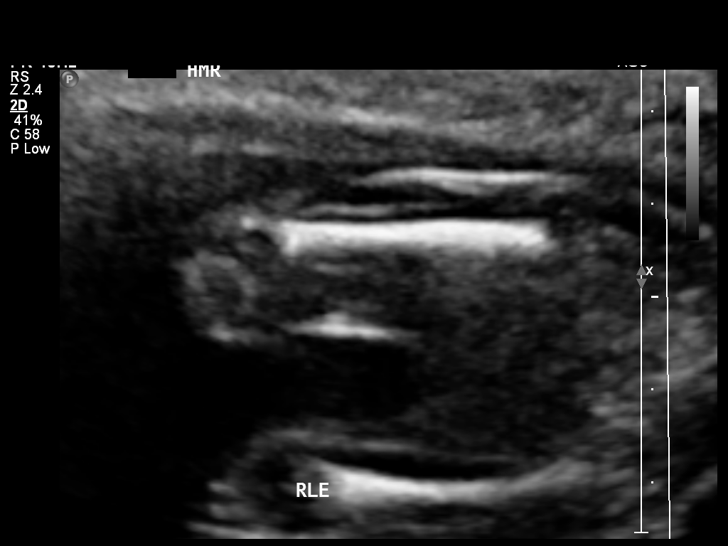
[im 40/89]
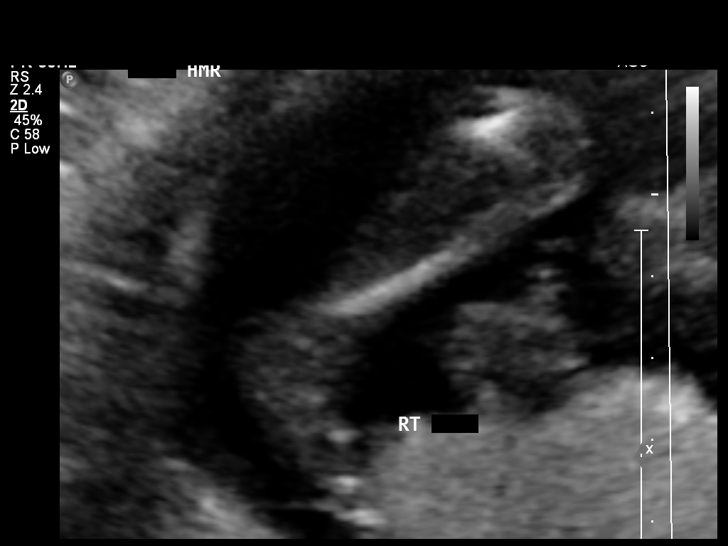
[im 49/89]
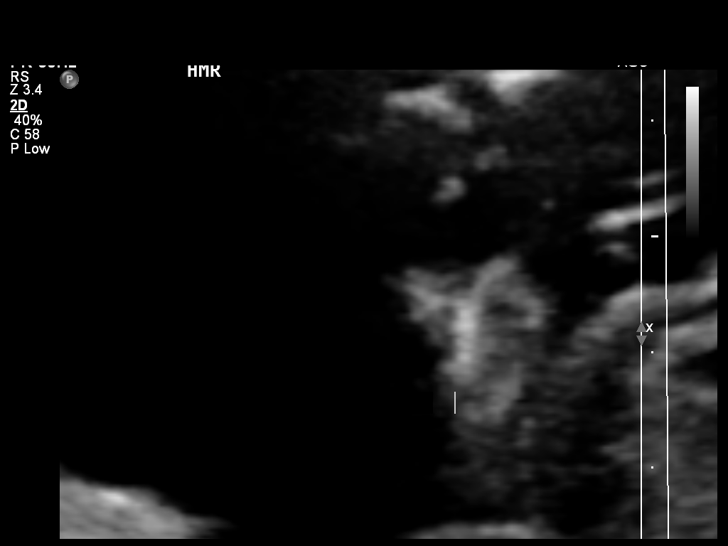
[im 56/89]
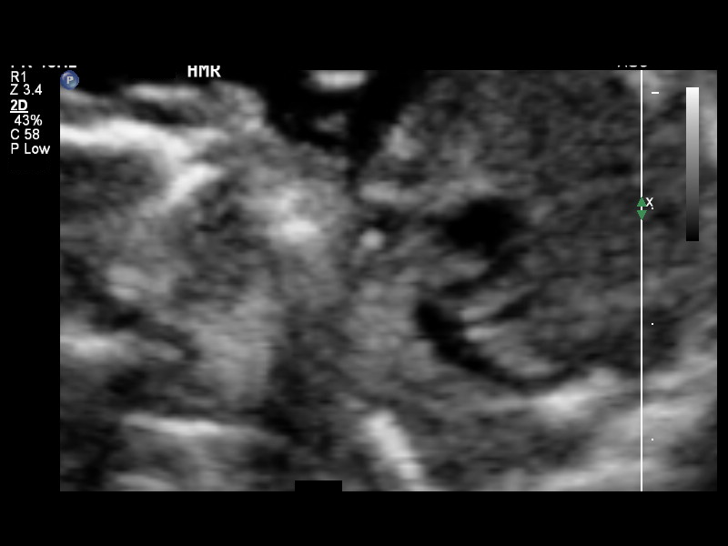
[im 62/89]
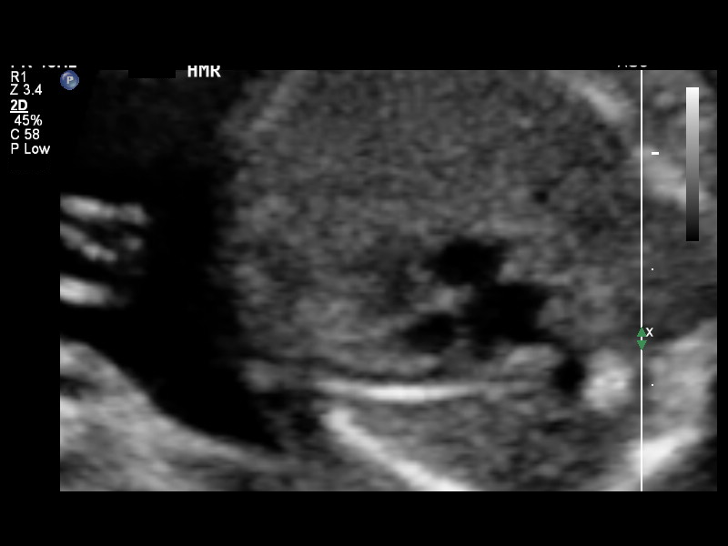
[im 72/89]
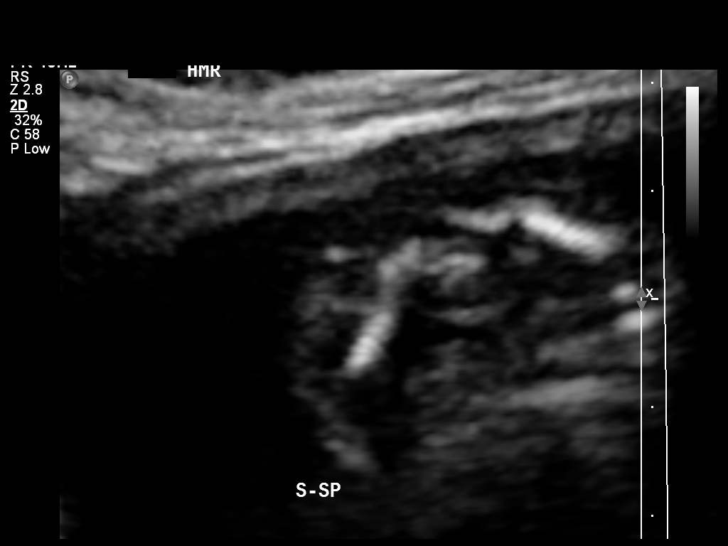
[im 79/89]
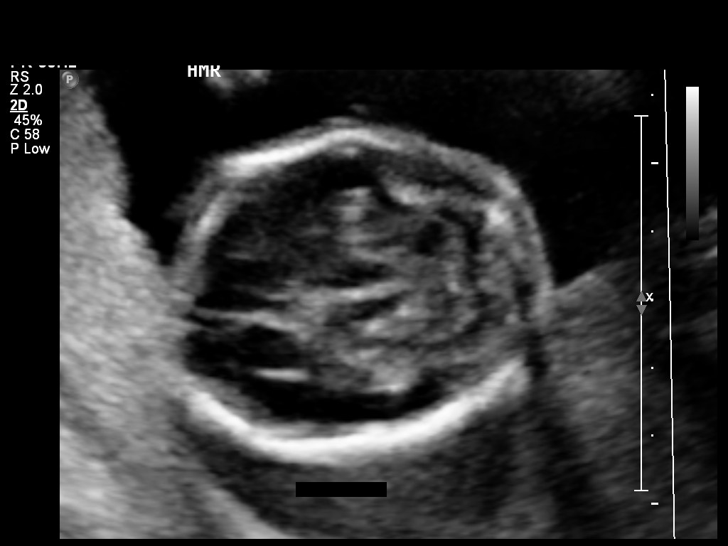
[im 85/89]
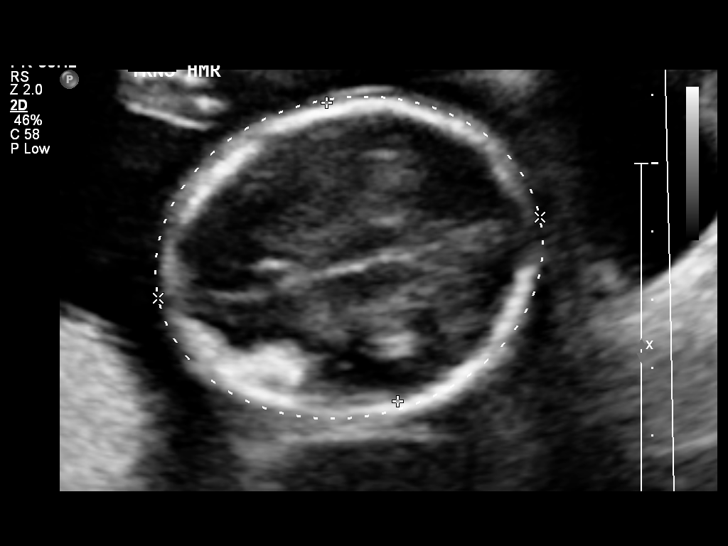

[12 of 28 positions shown; findings below may reference images not displayed]

FINDINGS: 1. Single intrauterine pregnancy.
 2. Fetal biometry is consistent with dating.
 3. Posterior placenta without evidence of previa.
 4. Normal amniotic fluid volume.
 5. Normal transabdominal cervical length.
 6. The anatomic survey is limited. No abnormalities seen.
Recommendations

 1. Appropriate fetal growth.
 2. Limited anatomy survey:
 - recommend follow up in 3-4 weeks to complete anatomic
 survey
 3. Normal first trimester screen and AFP

 questions or concerns.

## 2016-06-23 ENCOUNTER — Other Ambulatory Visit: Payer: Self-pay | Admitting: *Deleted

## 2016-06-23 DIAGNOSIS — Z3041 Encounter for surveillance of contraceptive pills: Secondary | ICD-10-CM

## 2016-06-23 MED ORDER — NORETHIN ACE-ETH ESTRAD-FE 1-20 MG-MCG PO TABS: 1.0000 | ORAL_TABLET | Freq: Every day | ORAL | 6 refills | Status: DC

## 2016-06-24 ENCOUNTER — Other Ambulatory Visit: Payer: Self-pay | Admitting: Family Medicine

## 2016-06-24 DIAGNOSIS — Z3041 Encounter for surveillance of contraceptive pills: Secondary | ICD-10-CM

## 2016-09-27 ENCOUNTER — Encounter: Payer: Self-pay | Admitting: Family Medicine

## 2016-09-27 ENCOUNTER — Ambulatory Visit (INDEPENDENT_AMBULATORY_CARE_PROVIDER_SITE_OTHER): Payer: BLUE CROSS/BLUE SHIELD | Admitting: Family Medicine

## 2016-09-27 VITALS — BP 149/86 | HR 67 | Ht 67.0 in | Wt 161.0 lb

## 2016-09-27 DIAGNOSIS — Z01419 Encounter for gynecological examination (general) (routine) without abnormal findings: Secondary | ICD-10-CM | POA: Diagnosis not present

## 2016-09-27 DIAGNOSIS — Z3041 Encounter for surveillance of contraceptive pills: Secondary | ICD-10-CM

## 2016-09-27 DIAGNOSIS — Z124 Encounter for screening for malignant neoplasm of cervix: Secondary | ICD-10-CM

## 2016-09-27 MED ORDER — NORETHIN ACE-ETH ESTRAD-FE 1-20 MG-MCG PO TABS: 1.0000 | ORAL_TABLET | Freq: Every day | ORAL | 3 refills | Status: DC

## 2016-09-27 NOTE — Progress Notes (Signed)
   Subjective:     Anne Phelps is a 31 y.o. female and is here for a comprehensive physical exam. The patient reports no problems.  Social History   Social History  . Marital status: Married    Spouse name: Tyrone SchimkeJeremy Kehl  . Number of children: 1  . 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 Smoking status: Never Smoker  . Smokeless tobacco: Never Used  . Alcohol use No  . Drug use: No  . Sexual activity: Yes    Partners: Male    Birth control/ protection: Pill   Other Topics Concern  . Not on file   Social History Narrative   Lives with her husband and their daughter.   Husband travels frequently for work.   Parents and in-laws live locally.   Health Maintenance  Topic Date Due  . INFLUENZA VACCINE  11/14/2016 (Originally 09/14/2016)  . PAP SMEAR  12/25/2017  . TETANUS/TDAP  08/15/2023  . HIV Screening  Completed    The following portions of the patient's history were reviewed and updated as appropriate: allergies, current medications, past family history, past medical history, past social history, past surgical history and problem list.  Review of Systems Pertinent items noted in HPI and remainder of comprehensive ROS otherwise negative.   Objective:    BP (!) 149/86   Pulse 67   Ht 5\' 7"  (1.702 m) Comment: per patient  Wt 161 lb (73 kg)   LMP 09/12/2016   BMI 25.22 kg/m  General appearance: alert, cooperative and appears stated age Head: Normocephalic, without obvious abnormality, atraumatic Neck: no adenopathy, supple, symmetrical, trachea midline and thyroid not enlarged, symmetric, no tenderness/mass/nodules Lungs: clear to auscultation bilaterally Breasts: normal appearance, no masses or tenderness Heart: regular rate and rhythm, S1, S2 normal, no murmur, click, rub or gallop Abdomen: soft, non-tender; bowel sounds normal; no masses,  no organomegaly Pelvic: cervix normal in appearance,  external genitalia normal, no adnexal masses or tenderness, no cervical motion tenderness, uterus normal size, shape, and consistency and vagina normal without discharge Extremities: extremities normal, atraumatic, no cyanosis or edema Pulses: 2+ and symmetric Skin: Skin color, texture, turgor normal. No rashes or lesions Lymph nodes: Cervical, supraclavicular, and axillary nodes normal. Neurologic: Grossly normal    Assessment:    Healthy female exam.      Plan:      Problem List Items Addressed This Visit    None    Visit Diagnoses    Well woman exam with routine gynecological exam    -  Primary   Relevant Orders   Lipid panel   CBC   TSH   Comprehensive metabolic panel   Screening for malignant neoplasm of cervix       Relevant Orders   Cytology - PAP   Encounter for gynecological examination without abnormal finding       Encounter for surveillance of contraceptive pills       Relevant Medications   norethindrone-ethinyl estradiol (JUNEL FE 1/20) 1-20 MG-MCG tablet      See After Visit Summary for Counseling Recommendations

## 2016-09-27 NOTE — Patient Instructions (Signed)
Preventive Care 18-39 Years, Female Preventive care refers to lifestyle choices and visits with your health care provider that can promote health and wellness. What does preventive care include?  A yearly physical exam. This is also called an annual well check.  Dental exams once or twice a year.  Routine eye exams. Ask your health care provider how often you should have your eyes checked.  Personal lifestyle choices, including: ? Daily care of your teeth and gums. ? Regular physical activity. ? Eating a healthy diet. ? Avoiding tobacco and drug use. ? Limiting alcohol use. ? Practicing safe sex. ? Taking vitamin and mineral supplements as recommended by your health care provider. What happens during an annual well check? The services and screenings done by your health care provider during your annual well check will depend on your age, overall health, lifestyle risk factors, and family history of disease. Counseling Your health care provider may ask you questions about your:  Alcohol use.  Tobacco use.  Drug use.  Emotional well-being.  Home and relationship well-being.  Sexual activity.  Eating habits.  Work and work Statistician.  Method of birth control.  Menstrual cycle.  Pregnancy history.  Screening You may have the following tests or measurements:  Height, weight, and BMI.  Diabetes screening. This is done by checking your blood sugar (glucose) after you have not eaten for a while (fasting).  Blood pressure.  Lipid and cholesterol levels. These may be checked every 5 years starting at age 66.  Skin check.  Hepatitis C blood test.  Hepatitis B blood test.  Sexually transmitted disease (STD) testing.  BRCA-related cancer screening. This may be done if you have a family history of breast, ovarian, tubal, or peritoneal cancers.  Pelvic exam and Pap test. This may be done every 3 years starting at age 40. Starting at age 59, this may be done every 5  years if you have a Pap test in combination with an HPV test.  Discuss your test results, treatment options, and if necessary, the need for more tests with your health care provider. Vaccines Your health care provider may recommend certain vaccines, such as:  Influenza vaccine. This is recommended every year.  Tetanus, diphtheria, and acellular pertussis (Tdap, Td) vaccine. You may need a Td booster every 10 years.  Varicella vaccine. You may need this if you have not been vaccinated.  HPV vaccine. If you are 69 or younger, you may need three doses over 6 months.  Measles, mumps, and rubella (MMR) vaccine. You may need at least one dose of MMR. You may also need a second dose.  Pneumococcal 13-valent conjugate (PCV13) vaccine. You may need this if you have certain conditions and were not previously vaccinated.  Pneumococcal polysaccharide (PPSV23) vaccine. You may need one or two doses if you smoke cigarettes or if you have certain conditions.  Meningococcal vaccine. One dose is recommended if you are age 27-21 years and a first-year college student living in a residence hall, or if you have one of several medical conditions. You may also need additional booster doses.  Hepatitis A vaccine. You may need this if you have certain conditions or if you travel or work in places where you may be exposed to hepatitis A.  Hepatitis B vaccine. You may need this if you have certain conditions or if you travel or work in places where you may be exposed to hepatitis B.  Haemophilus influenzae type b (Hib) vaccine. You may need this if  you have certain risk factors.  Talk to your health care provider about which screenings and vaccines you need and how often you need them. This information is not intended to replace advice given to you by your health care provider. Make sure you discuss any questions you have with your health care provider. Document Released: 03/29/2001 Document Revised: 10/21/2015  Document Reviewed: 12/02/2014 Elsevier Interactive Patient Education  2017 [employer].

## 2016-09-28 LAB — COMPREHENSIVE METABOLIC PANEL
A/G RATIO: 1.6 (ref 1.2–2.2)
ALT: 10 IU/L (ref 0–32)
AST: 17 IU/L (ref 0–40)
Albumin: 4.5 g/dL (ref 3.5–5.5)
Alkaline Phosphatase: 69 IU/L (ref 39–117)
BUN/Creatinine Ratio: 8 — ABNORMAL LOW (ref 9–23)
BUN: 6 mg/dL (ref 6–20)
Bilirubin Total: 0.3 mg/dL (ref 0.0–1.2)
CALCIUM: 9.7 mg/dL (ref 8.7–10.2)
CHLORIDE: 103 mmol/L (ref 96–106)
CO2: 22 mmol/L (ref 20–29)
Creatinine, Ser: 0.71 mg/dL (ref 0.57–1.00)
GFR, EST AFRICAN AMERICAN: 131 mL/min/{1.73_m2} (ref >59)
GFR, EST NON AFRICAN AMERICAN: 114 mL/min/{1.73_m2} (ref >59)
GLOBULIN, TOTAL: 2.9 g/dL (ref 1.5–4.5)
Glucose: 84 mg/dL (ref 65–99)
POTASSIUM: 4 mmol/L (ref 3.5–5.2)
SODIUM: 141 mmol/L (ref 134–144)
TOTAL PROTEIN: 7.4 g/dL (ref 6.0–8.5)

## 2016-09-28 LAB — CBC
HEMATOCRIT: 36.7 % (ref 34.0–46.6)
HEMOGLOBIN: 12.3 g/dL (ref 11.1–15.9)
MCH: 27.5 pg (ref 26.6–33.0)
MCHC: 33.5 g/dL (ref 31.5–35.7)
MCV: 82 fL (ref 79–97)
Platelets: 287 10*3/uL (ref 150–379)
RBC: 4.47 x10E6/uL (ref 3.77–5.28)
RDW: 13.4 % (ref 12.3–15.4)
WBC: 10.8 10*3/uL (ref 3.4–10.8)

## 2016-09-28 LAB — LIPID PANEL
CHOLESTEROL TOTAL: 188 mg/dL (ref 100–199)
Chol/HDL Ratio: 3.5 ratio (ref 0.0–4.4)
HDL: 53 mg/dL (ref >39)
LDL Calculated: 107 mg/dL — ABNORMAL HIGH (ref 0–99)
Triglycerides: 140 mg/dL (ref 0–149)
VLDL Cholesterol Cal: 28 mg/dL (ref 5–40)

## 2016-09-28 LAB — TSH: TSH: 1.8 u[IU]/mL (ref 0.450–4.500)

## 2016-09-29 LAB — CYTOLOGY - PAP
Diagnosis: NEGATIVE
HPV: NOT DETECTED

## 2017-07-27 ENCOUNTER — Encounter: Payer: Self-pay | Admitting: Radiology

## 2017-08-14 ENCOUNTER — Telehealth: Payer: Self-pay | Admitting: Radiology

## 2017-08-14 NOTE — Telephone Encounter (Signed)
Left voicemail for patient to call [facility], schedule Annual Exam After 09/27/17 with Dr Shawnie PonsPratt.Rhina Brackett. Sent letter but was returned wrong address. Obtain correct mailing address.

## 2017-08-23 ENCOUNTER — Other Ambulatory Visit: Payer: Self-pay

## 2017-08-23 DIAGNOSIS — Z3041 Encounter for surveillance of contraceptive pills: Secondary | ICD-10-CM

## 2017-08-23 MED ORDER — NORETHIN ACE-ETH ESTRAD-FE 1-20 MG-MCG PO TABS: 1.0000 | ORAL_TABLET | Freq: Every day | ORAL | 0 refills | Status: DC

## 2017-08-23 NOTE — Telephone Encounter (Signed)
Per telephone note...annual exam has been scheduled for 10/03/17 at 1:00 pm w/PCP.  Electronically sent medication refill on Junel FE 1/20 to  [facility] - [address]  Called patient - LMOVM in detail advising of OCP refill.

## 2017-09-29 ENCOUNTER — Other Ambulatory Visit: Payer: Self-pay | Admitting: Student

## 2017-09-29 DIAGNOSIS — Z3041 Encounter for surveillance of contraceptive pills: Secondary | ICD-10-CM

## 2017-10-02 NOTE — Progress Notes (Signed)
Last PAP: 09/27/16 - Normal Next: 09/28/2019

## 2017-10-03 ENCOUNTER — Ambulatory Visit (INDEPENDENT_AMBULATORY_CARE_PROVIDER_SITE_OTHER): Payer: BLUE CROSS/BLUE SHIELD | Admitting: Family Medicine

## 2017-10-03 ENCOUNTER — Encounter: Payer: Self-pay | Admitting: Family Medicine

## 2017-10-03 VITALS — BP 144/82 | HR 73 | Resp 16 | Ht 65.5 in | Wt 168.0 lb

## 2017-10-03 DIAGNOSIS — Z01419 Encounter for gynecological examination (general) (routine) without abnormal findings: Secondary | ICD-10-CM

## 2017-10-03 DIAGNOSIS — Z23 Encounter for immunization: Secondary | ICD-10-CM | POA: Diagnosis not present

## 2017-10-03 DIAGNOSIS — Z3041 Encounter for surveillance of contraceptive pills: Secondary | ICD-10-CM

## 2017-10-03 MED ORDER — NORETHIN ACE-ETH ESTRAD-FE 1-20 MG-MCG PO TABS: 1.0000 | ORAL_TABLET | Freq: Every day | ORAL | 3 refills | Status: DC

## 2017-10-03 NOTE — Progress Notes (Signed)
  Subjective:     Anne Phelps is a 32 y.o. female and is here for a comprehensive physical exam. The patient reports no problems.Using OC's. Undecided about more children.  The following portions of the patient's history were reviewed and updated as appropriate: allergies, current medications, past family history, past medical history, past social history, past surgical history and problem list.  Review of Systems Pertinent items noted in HPI and remainder of comprehensive ROS otherwise negative.   Objective:    BP (!) 144/82 (BP Location: Left Arm, Patient Position: Sitting, Cuff Size: Normal)   Pulse 73   Resp 16   Ht 5' 5.5" (1.664 m)   Wt 168 lb (76.2 kg)   LMP 09/11/2017 (Exact Date)   BMI 27.53 kg/m  General appearance: alert, cooperative and appears stated age Head: Normocephalic, without obvious abnormality, atraumatic Neck: no adenopathy, supple, symmetrical, trachea midline and thyroid not enlarged, symmetric, no tenderness/mass/nodules Lungs: clear to auscultation bilaterally Heart: regular rate and rhythm Abdomen: soft, non-tender; bowel sounds normal; no masses,  no organomegaly Extremities: extremities normal, atraumatic, no cyanosis or edema Skin: Skin color, texture, turgor normal. No rashes or lesions Neurologic: Grossly normal    Assessment:    Healthy female exam.      Plan:      Problem List Items Addressed This Visit    None    Visit Diagnoses    Encounter for gynecological examination without abnormal finding    -  Primary   Need for immunization against influenza       Relevant Orders   Flu Vaccine QUAD 36+ mos IM (Completed)   Encounter for surveillance of contraceptive pills       Relevant Medications   norethindrone-ethinyl estradiol (JUNEL FE 1/20) 1-20 MG-MCG tablet     Return in 1 year (on 10/04/2018).   See After Visit Summary for Counseling Recommendations

## 2017-10-03 NOTE — Patient Instructions (Signed)
Preventive Care 18-39 Years, Female Preventive care refers to lifestyle choices and visits with your health care provider that can promote health and wellness. What does preventive care include?  A yearly physical exam. This is also called an annual well check.  Dental exams once or twice a year.  Routine eye exams. Ask your health care provider how often you should have your eyes checked.  Personal lifestyle choices, including: ? Daily care of your teeth and gums. ? Regular physical activity. ? Eating a healthy diet. ? Avoiding tobacco and drug use. ? Limiting alcohol use. ? Practicing safe sex. ? Taking vitamin and mineral supplements as recommended by your health care provider. What happens during an annual well check? The services and screenings done by your health care provider during your annual well check will depend on your age, overall health, lifestyle risk factors, and family history of disease. Counseling Your health care provider may ask you questions about your:  Alcohol use.  Tobacco use.  Drug use.  Emotional well-being.  Home and relationship well-being.  Sexual activity.  Eating habits.  Work and work Statistician.  Method of birth control.  Menstrual cycle.  Pregnancy history.  Screening You may have the following tests or measurements:  Height, weight, and BMI.  Diabetes screening. This is done by checking your blood sugar (glucose) after you have not eaten for a while (fasting).  Blood pressure.  Lipid and cholesterol levels. These may be checked every 5 years starting at age 38.  Skin check.  Hepatitis C blood test.  Hepatitis B blood test.  Sexually transmitted disease (STD) testing.  BRCA-related cancer screening. This may be done if you have a family history of breast, ovarian, tubal, or peritoneal cancers.  Pelvic exam and Pap test. This may be done every 3 years starting at age 38. Starting at age 30, this may be done  every 5 years if you have a Pap test in combination with an HPV test.  Discuss your test results, treatment options, and if necessary, the need for more tests with your health care provider. Vaccines Your health care provider may recommend certain vaccines, such as:  Influenza vaccine. This is recommended every year.  Tetanus, diphtheria, and acellular pertussis (Tdap, Td) vaccine. You may need a Td booster every 10 years.  Varicella vaccine. You may need this if you have not been vaccinated.  HPV vaccine. If you are 39 or younger, you may need three doses over 6 months.  Measles, mumps, and rubella (MMR) vaccine. You may need at least one dose of MMR. You may also need a second dose.  Pneumococcal 13-valent conjugate (PCV13) vaccine. You may need this if you have certain conditions and were not previously vaccinated.  Pneumococcal polysaccharide (PPSV23) vaccine. You may need one or two doses if you smoke cigarettes or if you have certain conditions.  Meningococcal vaccine. One dose is recommended if you are age 68-21 years and a first-year college student living in a residence hall, or if you have one of several medical conditions. You may also need additional booster doses.  Hepatitis A vaccine. You may need this if you have certain conditions or if you travel or work in places where you may be exposed to hepatitis A.  Hepatitis B vaccine. You may need this if you have certain conditions or if you travel or work in places where you may be exposed to hepatitis B.  Haemophilus influenzae type b (Hib) vaccine. You may need this  if you have certain risk factors.  Talk to your health care provider about which screenings and vaccines you need and how often you need them. This information is not intended to replace advice given to you by your health care provider. Make sure you discuss any questions you have with your health care provider. Document Released: 03/29/2001 Document Revised:  10/21/2015 Document Reviewed: 12/02/2014 Elsevier Interactive Patient Education  2018 Elsevier Inc.  

## 2018-08-28 ENCOUNTER — Other Ambulatory Visit: Payer: Self-pay | Admitting: Family Medicine

## 2018-08-28 DIAGNOSIS — Z3041 Encounter for surveillance of contraceptive pills: Secondary | ICD-10-CM

## 2019-07-18 ENCOUNTER — Other Ambulatory Visit: Payer: Self-pay | Admitting: *Deleted

## 2019-07-18 DIAGNOSIS — Z3041 Encounter for surveillance of contraceptive pills: Secondary | ICD-10-CM

## 2019-07-18 MED ORDER — NORETHIN ACE-ETH ESTRAD-FE 1-20 MG-MCG PO TABS: 1.0000 | ORAL_TABLET | Freq: Every day | ORAL | 0 refills | Status: DC

## 2019-08-20 ENCOUNTER — Ambulatory Visit (INDEPENDENT_AMBULATORY_CARE_PROVIDER_SITE_OTHER): Payer: BC Managed Care – PPO | Admitting: Family Medicine

## 2019-08-20 ENCOUNTER — Other Ambulatory Visit (HOSPITAL_COMMUNITY)
Admission: RE | Admit: 2019-08-20 | Discharge: 2019-08-20 | Disposition: A | Discharge status: Home / Self Care | Payer: BC Managed Care – PPO | Source: Ambulatory Visit | Attending: Family Medicine | Admitting: Family Medicine

## 2019-08-20 ENCOUNTER — Encounter: Payer: Self-pay | Admitting: Family Medicine

## 2019-08-20 ENCOUNTER — Other Ambulatory Visit: Payer: Self-pay

## 2019-08-20 DIAGNOSIS — R030 Elevated blood-pressure reading, without diagnosis of hypertension: Secondary | ICD-10-CM | POA: Diagnosis not present

## 2019-08-20 DIAGNOSIS — Z124 Encounter for screening for malignant neoplasm of cervix: Secondary | ICD-10-CM | POA: Diagnosis not present

## 2019-08-20 DIAGNOSIS — Z13228 Encounter for screening for other metabolic disorders: Secondary | ICD-10-CM | POA: Diagnosis not present

## 2019-08-20 DIAGNOSIS — Z01419 Encounter for gynecological examination (general) (routine) without abnormal findings: Secondary | ICD-10-CM

## 2019-08-20 DIAGNOSIS — Z13220 Encounter for screening for lipoid disorders: Secondary | ICD-10-CM | POA: Diagnosis not present

## 2019-08-20 DIAGNOSIS — Z1329 Encounter for screening for other suspected endocrine disorder: Secondary | ICD-10-CM | POA: Diagnosis not present

## 2019-08-20 DIAGNOSIS — Z3041 Encounter for surveillance of contraceptive pills: Secondary | ICD-10-CM

## 2019-08-20 DIAGNOSIS — R87610 Atypical squamous cells of undetermined significance on cytologic smear of cervix (ASC-US): Secondary | ICD-10-CM | POA: Diagnosis not present

## 2019-08-20 LAB — CBC
Hematocrit: 41.1 % (ref 34.0–46.6)
Hemoglobin: 13.2 g/dL (ref 11.1–15.9)
MCH: 27.3 pg (ref 26.6–33.0)
MCHC: 32.1 g/dL (ref 31.5–35.7)
MCV: 85 fL (ref 79–97)
Platelets: 292 10*3/uL (ref 150–450)
RBC: 4.84 x10E6/uL (ref 3.77–5.28)
RDW: 12.5 % (ref 11.7–15.4)
WBC: 9.9 10*3/uL (ref 3.4–10.8)

## 2019-08-20 MED ORDER — NORETHIN ACE-ETH ESTRAD-FE 1-20 MG-MCG PO TABS: 1.0000 | ORAL_TABLET | Freq: Every day | ORAL | 0 refills | Status: DC

## 2019-08-20 NOTE — Progress Notes (Signed)
Last pap 09/27/2016 No sti testing

## 2019-08-20 NOTE — Progress Notes (Signed)
  Subjective:     Anne Phelps is a 34 y.o. female and is here for a comprehensive physical exam. The patient reports problems - anxiety. Likes her OCs, but BP is trending up. Undecided about having another baby.   The following portions of the patient's history were reviewed and updated as appropriate: allergies, current medications, past family history, past medical history, past social history, past surgical history and problem list.  Review of Systems Pertinent items noted in HPI and remainder of comprehensive ROS otherwise negative.   Objective:    BP (!) 153/93   Pulse 74   Wt 167 lb 9.6 oz (76 kg)   LMP 08/05/2019   BMI 27.47 kg/m  General appearance: alert, cooperative and appears stated age Head: Normocephalic, without obvious abnormality, atraumatic Neck: no adenopathy, supple, symmetrical, trachea midline and thyroid not enlarged, symmetric, no tenderness/mass/nodules Lungs: clear to auscultation bilaterally Breasts: normal appearance, no masses or tenderness Heart: regular rate and rhythm, S1, S2 normal, no murmur, click, rub or gallop Abdomen: soft, non-tender; bowel sounds normal; no masses,  no organomegaly Pelvic: cervix normal in appearance, external genitalia normal, no adnexal masses or tenderness, no cervical motion tenderness, uterus normal size, shape, and consistency and vagina normal without discharge Extremities: extremities normal, atraumatic, no cyanosis or edema Pulses: 2+ and symmetric Skin: Skin color, texture, turgor normal. No rashes or lesions Lymph nodes: Cervical, supraclavicular, and axillary nodes normal. Neurologic: Grossly normal    Assessment:    Healthy female exam.      Plan:   Problem List Items Addressed This Visit      Unprioritized   Elevated blood pressure reading    DASH diet, exercise, weight loss. Concern about OC's and BP--recheck in 3 months.       Other Visit Diagnoses    Screening for malignant neoplasm of cervix        Relevant Orders   Cytology - PAP( [place])   Encounter for gynecological examination without abnormal finding       Check labs   Relevant Orders   CBC   TSH   Comprehensive metabolic panel   VITAMIN D 25 Hydroxy (Vit-D Deficiency, Fractures)   Lipid panel   Encounter for surveillance of contraceptive pills       may need to consider alternative, if BP remains high   Relevant Medications   norethindrone-ethinyl estradiol (JUNEL FE 1/20) 1-20 MG-MCG tablet     Return in 3 months (on 11/20/2019) for BP check, 1year CPE.    See After Visit Summary for Counseling Recommendations

## 2019-08-20 NOTE — Assessment & Plan Note (Signed)
DASH diet, exercise, weight loss. Concern about OC's and BP--recheck in 3 months.

## 2019-08-20 NOTE — Patient Instructions (Addendum)
 Preventive Care 21-35 Years Old, Female Preventive care refers to visits with your health care provider and lifestyle choices that can promote health and wellness. This includes:  A yearly physical exam. This may also be called an annual well check.  Regular dental visits and eye exams.  Immunizations.  Screening for certain conditions.  Healthy lifestyle choices, such as eating a healthy diet, getting regular exercise, not using drugs or products that contain nicotine and tobacco, and limiting alcohol use. What can I expect for my preventive care visit? Physical exam Your health care provider will check your:  Height and weight. This may be used to calculate body mass index (BMI), which tells if you are at a healthy weight.  Heart rate and blood pressure.  Skin for abnormal spots. Counseling Your health care provider may ask you questions about your:  Alcohol, tobacco, and drug use.  Emotional well-being.  Home and relationship well-being.  Sexual activity.  Eating habits.  Work and work environment.  Method of birth control.  Menstrual cycle.  Pregnancy history. What immunizations do I need?  Influenza (flu) vaccine  This is recommended every year. Tetanus, diphtheria, and pertussis (Tdap) vaccine  You may need a Td booster every 10 years. Varicella (chickenpox) vaccine  You may need this if you have not been vaccinated. Human papillomavirus (HPV) vaccine  If recommended by your health care provider, you may need three doses over 6 months. Measles, mumps, and rubella (MMR) vaccine  You may need at least one dose of MMR. You may also need a second dose. Meningococcal conjugate (MenACWY) vaccine  One dose is recommended if you are age 19-21 years and a first-year college student living in a residence hall, or if you have one of several medical conditions. You may also need additional booster doses. Pneumococcal conjugate (PCV13) vaccine  You may need  this if you have certain conditions and were not previously vaccinated. Pneumococcal polysaccharide (PPSV23) vaccine  You may need one or two doses if you smoke cigarettes or if you have certain conditions. Hepatitis A vaccine  You may need this if you have certain conditions or if you travel or work in places where you may be exposed to hepatitis A. Hepatitis B vaccine  You may need this if you have certain conditions or if you travel or work in places where you may be exposed to hepatitis B. Haemophilus influenzae type b (Hib) vaccine  You may need this if you have certain conditions. You may receive vaccines as individual doses or as more than one vaccine together in one shot (combination vaccines). Talk with your health care provider about the risks and benefits of combination vaccines. What tests do I need?  Blood tests  Lipid and cholesterol levels. These may be checked every 5 years starting at age 20.  Hepatitis C test.  Hepatitis B test. Screening  Diabetes screening. This is done by checking your blood sugar (glucose) after you have not eaten for a while (fasting).  Sexually transmitted disease (STD) testing.  BRCA-related cancer screening. This may be done if you have a family history of breast, ovarian, tubal, or peritoneal cancers.  Pelvic exam and Pap test. This may be done every 3 years starting at age 21. Starting at age 30, this may be done every 5 years if you have a Pap test in combination with an HPV test. Talk with your health care provider about your test results, treatment options, and if necessary, the need for more   tests. Follow these instructions at home: Eating and drinking   Eat a diet that includes fresh fruits and vegetables, whole grains, lean protein, and low-fat dairy.  Take vitamin and mineral supplements as recommended by your health care provider.  Do not drink alcohol if: ? Your health care provider tells you not to drink. ? You are  pregnant, may be pregnant, or are planning to become pregnant.  If you drink alcohol: ? Limit how much you have to 0-1 drink a day. ? Be aware of how much alcohol is in your drink. In the [place] one drink equals one 12 oz bottle of beer (355 mL), one 5 oz glass of wine (148 mL), or one 1 oz glass of hard liquor (44 mL). Lifestyle  Take daily care of your teeth and gums.  Stay active. Exercise for at least 30 minutes on 5 or more days each week.  Do not use any products that contain nicotine or tobacco, such as cigarettes, e-cigarettes, and chewing tobacco. If you need help quitting, ask your health care provider.  If you are sexually active, practice safe sex. Use a condom or other form of birth control (contraception) in order to prevent pregnancy and STIs (sexually transmitted infections). If you plan to become pregnant, see your health care provider for a preconception visit. What's next?  Visit your health care provider once a year for a well check visit.  Ask your health care provider how often you should have your eyes and teeth checked.  Stay up to date on all vaccines. This information is not intended to replace advice given to you by your health care provider. Make sure you discuss any questions you have with your health care provider. Document Revised: 10/12/2017 Document Reviewed: 10/12/2017 Elsevier Patient Education  [place] DASH stands for "Dietary Approaches to Stop Hypertension." The DASH eating plan is a healthy eating plan that has been shown to reduce high blood pressure (hypertension). It may also reduce your risk for type 2 diabetes, heart disease, and stroke. The DASH eating plan may also help with weight loss. What are tips for following this plan?  General guidelines  Avoid eating more than 2,300 mg (milligrams) of salt (sodium) a day. If you have hypertension, you may need to reduce your sodium intake to 1,500 mg a day.  Limit  alcohol intake to no more than 1 drink a day for nonpregnant women and 2 drinks a day for men. One drink equals 12 oz of beer, 5 oz of wine, or 1 oz of hard liquor.  Work with your health care provider to maintain a healthy body weight or to lose weight. Ask what an ideal weight is for you.  Get at least 30 minutes of exercise that causes your heart to beat faster (aerobic exercise) most days of the week. Activities may include walking, swimming, or biking.  Work with your health care provider or diet and nutrition specialist (dietitian) to adjust your eating plan to your individual calorie needs. Reading food labels   Check food labels for the amount of sodium per serving. Choose foods with less than 5 percent of the Daily Value of sodium. Generally, foods with less than 300 mg of sodium per serving fit into this eating plan.  To find whole grains, look for the word "whole" as the first word in the ingredient list. Shopping  Buy products labeled as "low-sodium" or "no salt added."  Buy fresh foods. Avoid canned  foods and premade or frozen meals. Cooking  Avoid adding salt when cooking. Use salt-free seasonings or herbs instead of table salt or sea salt. Check with your health care provider or pharmacist before using salt substitutes.  Do not fry foods. Cook foods using healthy methods such as baking, boiling, grilling, and broiling instead.  Cook with heart-healthy oils, such as olive, canola, soybean, or sunflower oil. Meal planning  Eat a balanced diet that includes: ? 5 or more servings of fruits and vegetables each day. At each meal, try to fill half of your plate with fruits and vegetables. ? Up to 6-8 servings of whole grains each day. ? Less than 6 oz of lean meat, poultry, or fish each day. A 3-oz serving of meat is about the same size as a deck of cards. One egg equals 1 oz. ? 2 servings of low-fat dairy each day. ? A serving of nuts, seeds, or beans 5 times each  week. ? Heart-healthy fats. Healthy fats called Omega-3 fatty acids are found in foods such as flaxseeds and coldwater fish, like sardines, salmon, and mackerel.  Limit how much you eat of the following: ? Canned or prepackaged foods. ? Food that is high in trans fat, such as fried foods. ? Food that is high in saturated fat, such as fatty meat. ? Sweets, desserts, sugary drinks, and other foods with added sugar. ? Full-fat dairy products.  Do not salt foods before eating.  Try to eat at least 2 vegetarian meals each week.  Eat more home-cooked food and less restaurant, buffet, and fast food.  When eating at a restaurant, ask that your food be prepared with less salt or no salt, if possible. What foods are recommended? The items listed may not be a complete list. Talk with your dietitian about what dietary choices are best for you. Grains Whole-grain or whole-wheat bread. Whole-grain or whole-wheat pasta. Brown rice. [place]. Bulgur. Whole-grain and low-sodium cereals. Pita bread. Low-fat, low-sodium crackers. Whole-wheat flour tortillas. Vegetables Fresh or frozen vegetables (raw, steamed, roasted, or grilled). Low-sodium or reduced-sodium tomato and vegetable juice. Low-sodium or reduced-sodium tomato sauce and tomato paste. Low-sodium or reduced-sodium canned vegetables. Fruits All fresh, dried, or frozen fruit. Canned fruit in natural juice (without added sugar). Meat and other protein foods Skinless chicken or [place]. Ground chicken or [place]. Pork with fat trimmed off. Fish and seafood. Egg whites. Dried beans, peas, or lentils. Unsalted nuts, nut butters, and seeds. Unsalted canned beans. Lean cuts of beef with fat trimmed off. Low-sodium, lean deli meat. Dairy Low-fat (1%) or fat-free (skim) milk. Fat-free, low-fat, or reduced-fat cheeses. Nonfat, low-sodium ricotta or cottage cheese. Low-fat or nonfat yogurt. Low-fat, low-sodium cheese. Fats and oils Soft margarine  without trans fats. Vegetable oil. Low-fat, reduced-fat, or light mayonnaise and salad dressings (reduced-sodium). Canola, safflower, olive, soybean, and sunflower oils. Avocado. Seasoning and other foods Herbs. Spices. Seasoning mixes without salt. Unsalted popcorn and pretzels. Fat-free sweets. What foods are not recommended? The items listed may not be a complete list. Talk with your dietitian about what dietary choices are best for you. Grains Baked goods made with fat, such as croissants, muffins, or some breads. Dry pasta or rice meal packs. Vegetables Creamed or fried vegetables. Vegetables in a cheese sauce. Regular canned vegetables (not low-sodium or reduced-sodium). Regular canned tomato sauce and paste (not low-sodium or reduced-sodium). Regular tomato and vegetable juice (not low-sodium or reduced-sodium). Angie Fava. Olives. Fruits Canned fruit in a light or heavy syrup. Maceo Pro  fruit. Fruit in cream or butter sauce. Meat and other protein foods Fatty cuts of meat. Ribs. Fried meat. Berniece Salines. Sausage. Bologna and other processed lunch meats. Salami. Fatback. Hotdogs. Bratwurst. Salted nuts and seeds. Canned beans with added salt. Canned or smoked fish. Whole eggs or egg yolks. Chicken or [place] with skin. Dairy Whole or 2% milk, cream, and half-and-half. Whole or full-fat cream cheese. Whole-fat or sweetened yogurt. Full-fat cheese. Nondairy creamers. Whipped toppings. Processed cheese and cheese spreads. Fats and oils Butter. Stick margarine. Lard. Shortening. Ghee. Bacon fat. Tropical oils, such as coconut, palm kernel, or palm oil. Seasoning and other foods Salted popcorn and pretzels. Onion salt, garlic salt, seasoned salt, table salt, and sea salt. Worcestershire sauce. Tartar sauce. Barbecue sauce. Teriyaki sauce. Soy sauce, including reduced-sodium. Steak sauce. Canned and packaged gravies. Fish sauce. Oyster sauce. Cocktail sauce. Horseradish that you find on the shelf. Ketchup.  Mustard. Meat flavorings and tenderizers. Bouillon cubes. Hot sauce and Tabasco sauce. Premade or packaged marinades. Premade or packaged taco seasonings. Relishes. Regular salad dressings. Where to find more information:  National Heart, Lung, and [place]: https://wilson-eaton.com/  American Heart Association: www.heart.org Summary  The DASH eating plan is a healthy eating plan that has been shown to reduce high blood pressure (hypertension). It may also reduce your risk for type 2 diabetes, heart disease, and stroke.  With the DASH eating plan, you should limit salt (sodium) intake to 2,300 mg a day. If you have hypertension, you may need to reduce your sodium intake to 1,500 mg a day.  When on the DASH eating plan, aim to eat more fresh fruits and vegetables, whole grains, lean proteins, low-fat dairy, and heart-healthy fats.  Work with your health care provider or diet and nutrition specialist (dietitian) to adjust your eating plan to your individual calorie needs. This information is not intended to replace advice given to you by your health care provider. Make sure you discuss any questions you have with your health care provider. Document Revised: 01/13/2017 Document Reviewed: 01/25/2016 Elsevier Patient Education  2020 [employer].

## 2019-08-21 LAB — VITAMIN D 25 HYDROXY (VIT D DEFICIENCY, FRACTURES): Vit D, 25-Hydroxy: 33.2 ng/mL (ref 30.0–100.0)

## 2019-08-21 LAB — COMPREHENSIVE METABOLIC PANEL
ALT: 9 IU/L (ref 0–32)
AST: 18 IU/L (ref 0–40)
Albumin/Globulin Ratio: 1.5 (ref 1.2–2.2)
Albumin: 4.5 g/dL (ref 3.8–4.8)
Alkaline Phosphatase: 74 IU/L (ref 48–121)
BUN/Creatinine Ratio: 10 (ref 9–23)
BUN: 8 mg/dL (ref 6–20)
Bilirubin Total: 0.2 mg/dL (ref 0.0–1.2)
CO2: 24 mmol/L (ref 20–29)
Calcium: 10.1 mg/dL (ref 8.7–10.2)
Chloride: 99 mmol/L (ref 96–106)
Creatinine, Ser: 0.78 mg/dL (ref 0.57–1.00)
GFR calc Af Amer: 115 mL/min/{1.73_m2} (ref >59)
GFR calc non Af Amer: 99 mL/min/{1.73_m2} (ref >59)
Globulin, Total: 3.1 g/dL (ref 1.5–4.5)
Glucose: 105 mg/dL — ABNORMAL HIGH (ref 65–99)
Potassium: 3.6 mmol/L (ref 3.5–5.2)
Sodium: 143 mmol/L (ref 134–144)
Total Protein: 7.6 g/dL (ref 6.0–8.5)

## 2019-08-21 LAB — LIPID PANEL
Chol/HDL Ratio: 3.3 ratio (ref 0.0–4.4)
Cholesterol, Total: 206 mg/dL — ABNORMAL HIGH (ref 100–199)
HDL: 62 mg/dL (ref >39)
LDL Chol Calc (NIH): 118 mg/dL — ABNORMAL HIGH (ref 0–99)
Triglycerides: 150 mg/dL — ABNORMAL HIGH (ref 0–149)
VLDL Cholesterol Cal: 26 mg/dL (ref 5–40)

## 2019-08-21 LAB — TSH: TSH: 2.53 u[IU]/mL (ref 0.450–4.500)

## 2019-08-22 LAB — CYTOLOGY - PAP
Comment: NEGATIVE
Diagnosis: UNDETERMINED — AB
High risk HPV: NEGATIVE

## 2019-11-14 DIAGNOSIS — Z3041 Encounter for surveillance of contraceptive pills: Secondary | ICD-10-CM

## 2019-11-15 MED ORDER — NORETHIN ACE-ETH ESTRAD-FE 1-20 MG-MCG PO TABS: 1.0000 | ORAL_TABLET | Freq: Every day | ORAL | 3 refills | Status: DC

## 2019-11-20 ENCOUNTER — Ambulatory Visit: Payer: BC Managed Care – PPO

## 2020-12-29 ENCOUNTER — Ambulatory Visit (INDEPENDENT_AMBULATORY_CARE_PROVIDER_SITE_OTHER): Payer: BC Managed Care – PPO | Admitting: *Deleted

## 2020-12-29 ENCOUNTER — Other Ambulatory Visit: Payer: Self-pay

## 2020-12-29 ENCOUNTER — Ambulatory Visit (INDEPENDENT_AMBULATORY_CARE_PROVIDER_SITE_OTHER): Payer: BC Managed Care – PPO

## 2020-12-29 VITALS — BP 148/90 | HR 85 | Wt 172.0 lb

## 2020-12-29 DIAGNOSIS — Z3481 Encounter for supervision of other normal pregnancy, first trimester: Secondary | ICD-10-CM | POA: Diagnosis not present

## 2020-12-29 DIAGNOSIS — Z3A08 8 weeks gestation of pregnancy: Secondary | ICD-10-CM | POA: Diagnosis not present

## 2020-12-29 DIAGNOSIS — O0990 Supervision of high risk pregnancy, unspecified, unspecified trimester: Secondary | ICD-10-CM | POA: Insufficient documentation

## 2020-12-29 DIAGNOSIS — O3680X0 Pregnancy with inconclusive fetal viability, not applicable or unspecified: Secondary | ICD-10-CM

## 2020-12-29 DIAGNOSIS — Z3480 Encounter for supervision of other normal pregnancy, unspecified trimester: Secondary | ICD-10-CM | POA: Insufficient documentation

## 2020-12-29 NOTE — Progress Notes (Signed)
Patient was assessed and managed by nursing staff during this encounter. I have reviewed the chart and agree with the documentation and plan.   Jaynie Collins, MD 12/29/2020 10:31 AM

## 2020-12-29 NOTE — Progress Notes (Signed)
New OB Intake  I explained I am completing New OB Intake today. We discussed her EDD of 08/04/2021 that is based on LMP of 10/28/2020. Pt is G2/P1. I reviewed her allergies, medications, Medical/Surgical/OB history, and appropriate screenings. I informed her of [facility] services. Based on history, this is a/an  pregnancy uncomplicated .   Patient Active Problem List   Diagnosis Date Noted   Elevated blood pressure reading 08/20/2019   BMI 25.0-25.9,adult 04/11/2015   Autoimmune disorder (HCC) 03/27/2013    Concerns addressed today  Delivery Plans:  Plans to deliver at [facility].   MyChart/Babyscripts MyChart access verified. I explained pt will have some visits in office and some virtually. Babyscripts instructions given and order placed. Patient verifies receipt of registration text/e-mail. Account successfully created and app downloaded.  Blood Pressure Cuff  Patient given BP cuff from office.  Explained after first prenatal appt pt will check weekly and document in Babyscripts.  Anatomy US Explained first scheduled [place] will be around 19 weeks.   Labs Discussed Avelina Laine genetic screening with patient. Will have Panorama at new OB visit. Routine prenatal labs needed.   Placed OB Box on problem list and updated   Patient informed that the ultrasound is considered a limited obstetric ultrasound and is not intended to be a complete ultrasound exam.  Patient also informed that the ultrasound is not being completed with the intent of assessing for fetal or placental anomalies or any pelvic abnormalities. Explained that the purpose of today's ultrasound is to assess for dating and fetal heart rate.  Patient acknowledges the purpose of the exam and the limitations of the study.      First visit review I reviewed new OB appt with pt. I explained she will have a pelvic exam, ob bloodwork with genetic screening, and PAP smear. Explained pt will be seen by Dr Vergie Living at first visit; encounter routed to  appropriate provider. Explained that patient will be seen by pregnancy navigator following visit with provider.     Scheryl Marten, RN 12/29/2020  9:08 AM

## 2021-01-21 ENCOUNTER — Other Ambulatory Visit (HOSPITAL_COMMUNITY)
Admission: RE | Admit: 2021-01-21 | Discharge: 2021-01-21 | Disposition: A | Discharge status: Home / Self Care | Payer: BC Managed Care – PPO | Source: Ambulatory Visit | Attending: Obstetrics and Gynecology | Admitting: Obstetrics and Gynecology

## 2021-01-21 ENCOUNTER — Other Ambulatory Visit: Payer: Self-pay

## 2021-01-21 ENCOUNTER — Ambulatory Visit (INDEPENDENT_AMBULATORY_CARE_PROVIDER_SITE_OTHER): Payer: BC Managed Care – PPO | Admitting: Obstetrics and Gynecology

## 2021-01-21 ENCOUNTER — Encounter: Payer: Self-pay | Admitting: Obstetrics and Gynecology

## 2021-01-21 VITALS — BP 131/84 | HR 79 | Wt 172.0 lb

## 2021-01-21 DIAGNOSIS — O0991 Supervision of high risk pregnancy, unspecified, first trimester: Secondary | ICD-10-CM

## 2021-01-21 DIAGNOSIS — Z3A12 12 weeks gestation of pregnancy: Secondary | ICD-10-CM

## 2021-01-21 DIAGNOSIS — Z3480 Encounter for supervision of other normal pregnancy, unspecified trimester: Secondary | ICD-10-CM | POA: Diagnosis not present

## 2021-01-21 DIAGNOSIS — Z3481 Encounter for supervision of other normal pregnancy, first trimester: Secondary | ICD-10-CM | POA: Diagnosis not present

## 2021-01-21 DIAGNOSIS — O209 Hemorrhage in early pregnancy, unspecified: Secondary | ICD-10-CM | POA: Insufficient documentation

## 2021-01-21 DIAGNOSIS — O10919 Unspecified pre-existing hypertension complicating pregnancy, unspecified trimester: Secondary | ICD-10-CM

## 2021-01-21 DIAGNOSIS — O360930 Maternal care for other rhesus isoimmunization, third trimester, not applicable or unspecified: Secondary | ICD-10-CM | POA: Diagnosis not present

## 2021-01-21 DIAGNOSIS — O09521 Supervision of elderly multigravida, first trimester: Secondary | ICD-10-CM

## 2021-01-21 DIAGNOSIS — Z6791 Unspecified blood type, Rh negative: Secondary | ICD-10-CM | POA: Diagnosis not present

## 2021-01-21 DIAGNOSIS — O131 Gestational [pregnancy-induced] hypertension without significant proteinuria, first trimester: Secondary | ICD-10-CM | POA: Diagnosis not present

## 2021-01-21 DIAGNOSIS — L409 Psoriasis, unspecified: Secondary | ICD-10-CM

## 2021-01-21 DIAGNOSIS — O09523 Supervision of elderly multigravida, third trimester: Secondary | ICD-10-CM | POA: Insufficient documentation

## 2021-01-21 DIAGNOSIS — O0990 Supervision of high risk pregnancy, unspecified, unspecified trimester: Secondary | ICD-10-CM

## 2021-01-21 DIAGNOSIS — F53 Postpartum depression: Secondary | ICD-10-CM

## 2021-01-21 DIAGNOSIS — I10 Essential (primary) hypertension: Secondary | ICD-10-CM | POA: Insufficient documentation

## 2021-01-21 DIAGNOSIS — O26899 Other specified pregnancy related conditions, unspecified trimester: Secondary | ICD-10-CM

## 2021-01-21 MED ORDER — ASPIRIN EC 81 MG PO TBEC: 81.0000 mg | DELAYED_RELEASE_TABLET | Freq: Every day | ORAL | 2 refills | Status: DC

## 2021-01-21 MED ORDER — RHO D IMMUNE GLOBULIN 1500 UNIT/2ML IJ SOSY
300.0000 ug | PREFILLED_SYRINGE | Freq: Once | INTRAMUSCULAR | Status: AC
Start: 2021-01-21 — End: 2021-01-21
  Administered 2021-01-21: 300 ug via INTRAMUSCULAR

## 2021-01-21 NOTE — Progress Notes (Signed)
New OB Note  01/21/2021   Clinic: Center for [facility]  Chief Complaint: new OB. Vaginal spotting and passed a clot yesterday  Transfer of Care Patient: no  History of Present Illness: Ms. Allebach is a 35 y.o. G2P1001 @ 12/1 weeks (EDC 6/21, based on Patient's last menstrual period was 10/28/2020.=8wk u/s.  Preg complicated by has Rh negative state in antepartum period; Psoriasis; BMI 25.0-25.9,adult; Elevated blood pressure reading; Supervision of other normal pregnancy, antepartum; Vaginal bleeding affecting early pregnancy; Transient hypertension of pregnancy in first trimester; Chronic hypertension affecting pregnancy; and Multigravida of advanced maternal age in first trimester on their problem list.   No recent sexual intercourse  ROS: A 12-point review of systems was performed and negative, except as stated in the above HPI.  OBGYN History: As per HPI. OB History  Gravida Para Term Preterm AB Living  2 1 1     1   SAB IAB Ectopic Multiple Live Births          1    # Outcome Date GA Lbr Len/2nd Weight Sex Delivery Anes PTL Lv  2 Current           1 Term 11/03/13 [redacted]w[redacted]d 10:25 / 02:42 7 lb 10.1 oz (3.46 kg) F Vag-Spont EPI  LIV    Any issues with any prior pregnancies: no Prior children are healthy, doing well, and without any problems or issues: yes History of pap smears: Yes. Last pap smear 2021 and results were ascus/hpv neg   Past Medical History: Past Medical History:  Diagnosis Date   Postpartum depression 11/29/2013   Psoriasis     Past Surgical History: Past Surgical History:  Procedure Laterality Date   NO PAST SURGERIES      Family History:  Family History  Problem Relation Age of Onset   Diabetes Maternal Grandmother    Hypertension Maternal Grandmother    Stroke Maternal Grandmother    Heart attack Father    Hypertension Father    Hypertension Mother    Hypercholesterolemia Mother    Psoriasis Mother    Hypertension Maternal  Grandfather    Hyperlipidemia Maternal Grandfather    Hyperlipidemia Paternal Grandfather    Hypertension Paternal Grandfather    Heart disease Paternal Grandfather    Stroke Paternal Grandfather    Breast cancer Cousin 30    Social History:  Social History   Socioeconomic History   Marital status: Married    Spouse name: Breunna Nordmann   Number of children: 1   Years of education: Associates   Highest education level: Not on file  Occupational History   Occupation: child care  Tobacco Use   Smoking status: Never   Smokeless tobacco: Never  Substance and Sexual Activity   Alcohol use: No    Alcohol/week: 0.0 standard drinks   Drug use: No   Sexual activity: Yes    Partners: Male  Other Topics Concern   Not on file  Social History Narrative   Lives with her husband and their daughter.   Husband travels frequently for work.   Parents and in-laws live locally.   Social Determinants of Health   Financial Resource Strain: Not on file  Food Insecurity: Not on file  Transportation Needs: Not on file  Physical Activity: Not on file  Stress: Not on file  Social Connections: Not on file  Intimate Partner Violence: Not on file    Allergy: Allergies  Allergen Reactions   Penicillins Swelling and Rash  Throat swells    Current Outpatient Medications: Prenatal vitamin @CMEDTAKING @  Physical Exam:   BP 131/84   Pulse 79   Wt 172 lb (78 kg)   LMP 10/28/2020   BMI 28.19 kg/m  Body mass index is 28.19 kg/m. Contractions: Not present   FHTs: 160s  General appearance: Well nourished, well developed female in no acute distress.  Cardiovascular: S1, S2 normal, no murmur, rub or gallop, regular rate and rhythm Respiratory:  Clear to auscultation bilateral. Normal respiratory effort Abdomen: positive bowel sounds and no masses, hernias; diffusely non tender to palpation, non distended Breasts: no breast s/s Neuro/Psych:  Normal mood and affect.  Skin:  Warm and  dry.  Lymphatic:  No inguinal lymphadenopathy.   Pelvic exam: is not limited by body habitus EGBUS: within normal limits, Vagina: +white d/c in vault, Cervix: normal appearing cervix without discharge or lesions, closed/long/high, somewhat friable. Uterus:  enlarged, c/w 12 week size, and Adnexa:  normal adnexa and no mass, fullness, tenderness  Laboratory: none  Imaging:  Bedside u/s with SLIUP c/w 12wks, normal FHR, +FM  Assessment: pt doing well.   Plan: 1. Supervision of other normal pregnancy, antepartum Offer afp nv. Anatomy u/s ordered for 18-19wks. Patient amenable to starting low dose ASA - CBC/D/Plt+RPR+Rh+ABO+RubIgG... - Genetic Screening - Culture, OB Urine - TSH - Comprehensive metabolic panel - Protein / creatinine ratio, urine - Hemoglobin A1c  2. Rh negative state in antepartum period Rhogam today  3. Transient hypertension of pregnancy in first trimester See below  4. Vaginal bleeding affecting early pregnancy - Cervicovaginal ancillary only( [place])  5. Postpartum depression No issues  6. [redacted] weeks gestation of pregnancy  7. Multigravida of advanced maternal age in first trimester See above  8. Chronic hypertension affecting pregnancy 148/90 at her 8wk u/s and 151/92 today initially with 131/84 on 50m rpt. Pt has BP cuff at home and states they are normal. No HTN in prior pregnancy but I saw a few mild range pressures in her past VS, so I told her I recommend calling her cHTN  9. Supervision of high risk pregnancy in first trimester - 9m MFM OB DETAIL +14 WK; Future  10. History of psoriasis No issues on no meds  Problem list reviewed and updated.  Follow up in 2wks for bp check. 4wk for ob visit  >50% of 35 min visit spent on counseling and coordination of care.     [place] MD Attending Center for [facility])

## 2021-01-22 LAB — COMPREHENSIVE METABOLIC PANEL
ALT: 18 IU/L (ref 0–32)
AST: 19 IU/L (ref 0–40)
Albumin/Globulin Ratio: 1.5 (ref 1.2–2.2)
Albumin: 4.4 g/dL (ref 3.8–4.8)
Alkaline Phosphatase: 70 IU/L (ref 44–121)
BUN/Creatinine Ratio: 9 (ref 9–23)
BUN: 5 mg/dL — ABNORMAL LOW (ref 6–20)
Bilirubin Total: 0.3 mg/dL (ref 0.0–1.2)
CO2: 21 mmol/L (ref 20–29)
Calcium: 9.6 mg/dL (ref 8.7–10.2)
Chloride: 100 mmol/L (ref 96–106)
Creatinine, Ser: 0.54 mg/dL — ABNORMAL LOW (ref 0.57–1.00)
Globulin, Total: 3 g/dL (ref 1.5–4.5)
Glucose: 80 mg/dL (ref 70–99)
Potassium: 4 mmol/L (ref 3.5–5.2)
Sodium: 137 mmol/L (ref 134–144)
Total Protein: 7.4 g/dL (ref 6.0–8.5)
eGFR: 122 mL/min/{1.73_m2} (ref >59)

## 2021-01-22 LAB — CBC/D/PLT+RPR+RH+ABO+RUBIGG...
Antibody Screen: NEGATIVE
Basophils Absolute: 0 10*3/uL (ref 0.0–0.2)
Basos: 0 %
EOS (ABSOLUTE): 0.2 10*3/uL (ref 0.0–0.4)
Eos: 2 %
HCV Ab: 0.1 s/co ratio (ref 0.0–0.9)
HIV Screen 4th Generation wRfx: NONREACTIVE
Hematocrit: 36.5 % (ref 34.0–46.6)
Hemoglobin: 12.1 g/dL (ref 11.1–15.9)
Hepatitis B Surface Ag: NEGATIVE
Immature Grans (Abs): 0 10*3/uL (ref 0.0–0.1)
Immature Granulocytes: 0 %
Lymphocytes Absolute: 1.9 10*3/uL (ref 0.7–3.1)
Lymphs: 20 %
MCH: 28.7 pg (ref 26.6–33.0)
MCHC: 33.2 g/dL (ref 31.5–35.7)
MCV: 87 fL (ref 79–97)
Monocytes Absolute: 0.5 10*3/uL (ref 0.1–0.9)
Monocytes: 5 %
Neutrophils Absolute: 6.9 10*3/uL (ref 1.4–7.0)
Neutrophils: 73 %
Platelets: 285 10*3/uL (ref 150–450)
RBC: 4.22 x10E6/uL (ref 3.77–5.28)
RDW: 12.1 % (ref 11.7–15.4)
RPR Ser Ql: NONREACTIVE
Rh Factor: NEGATIVE
Rubella Antibodies, IGG: 12.5 index (ref >0.99)
WBC: 9.7 10*3/uL (ref 3.4–10.8)

## 2021-01-22 LAB — HCV INTERPRETATION

## 2021-01-22 LAB — CERVICOVAGINAL ANCILLARY ONLY
Bacterial Vaginitis (gardnerella): NEGATIVE
Candida Glabrata: NEGATIVE
Candida Vaginitis: POSITIVE — AB
Chlamydia: NEGATIVE
Comment: NEGATIVE
Comment: NEGATIVE
Comment: NEGATIVE
Comment: NEGATIVE
Comment: NEGATIVE
Comment: NORMAL
Neisseria Gonorrhea: NEGATIVE
Trichomonas: NEGATIVE

## 2021-01-22 LAB — TSH: TSH: 2.57 u[IU]/mL (ref 0.450–4.500)

## 2021-01-22 LAB — HEMOGLOBIN A1C
Est. average glucose Bld gHb Est-mCnc: 105 mg/dL
Hgb A1c MFr Bld: 5.3 % (ref 4.8–5.6)

## 2021-01-22 LAB — PROTEIN / CREATININE RATIO, URINE
Creatinine, Urine: 9.2 mg/dL
Protein, Ur: <4 mg/dL

## 2021-01-23 LAB — URINE CULTURE, OB REFLEX

## 2021-01-23 LAB — CULTURE, OB URINE

## 2021-02-01 ENCOUNTER — Encounter: Payer: Self-pay | Admitting: Radiology

## 2021-02-01 ENCOUNTER — Telehealth: Payer: Self-pay | Admitting: Radiology

## 2021-02-01 NOTE — Telephone Encounter (Signed)
Patient was informed of Panorama results including fetal sex 

## 2021-02-04 ENCOUNTER — Other Ambulatory Visit: Payer: Self-pay

## 2021-02-04 ENCOUNTER — Ambulatory Visit (INDEPENDENT_AMBULATORY_CARE_PROVIDER_SITE_OTHER): Payer: BC Managed Care – PPO | Admitting: *Deleted

## 2021-02-04 DIAGNOSIS — R030 Elevated blood-pressure reading, without diagnosis of hypertension: Secondary | ICD-10-CM

## 2021-02-04 NOTE — Progress Notes (Addendum)
Subjective:  Anne Phelps is a 35 y.o. female here for BP check.   Hypertension ROS: Patient denies any headaches, visual symptoms, RUQ/epigastric pain or other concerning symptoms.  Objective:  BP (!) 141/94    Pulse 67    LMP 10/28/2020   Appearance alert, well appearing, and in no distress. General exam BP noted to be elevated today in office.    Assessment:   Blood Pressure  Discussed BP with Dr A, showed babyscripts BP's from home.  . Per Dr A elevated BP in office is white coat related.   Plan:  Follow up at next ROB or as needed.          Scheryl Marten, RN     Patient was assessed and managed by nursing staff during this encounter. I have reviewed the chart and agree with the documentation and plan. I have also made any necessary editorial changes.  Jaynie Collins, MD 02/04/2021 1:01 PM

## 2021-02-14 NOTE — L&D Delivery Note (Signed)
Delivery Note Anne Phelps is a 36 y.o. G2P1001 at [redacted]w[redacted]d admitted for IOL d/t cHTN and A1GDM.   GBS Status: Negative/-- (05/25 0900) Maximum Maternal Temperature: 98.7  Labor course: Initial SVE: 1/50/-2. Augmentation with: AROM, Pitocin, and Cytotec. She then progressed to complete.  ROM: 7h 16m with clear fluid  Birth: At 1734 a viable female was delivered via spontaneous vaginal delivery (Presentation:cephalic;ROA). Nuchal cord present: Yes, somersaulted and delivered through.  Shoulders and body delivered in usual fashion. Infant placed directly on mom's abdomen for bonding/skin-to-skin, baby dried and stimulated. Cord clamped x 2 after 1 minute and cut by FOB-Jeremy. Cord blood collected.  The placenta separated spontaneously and delivered via gentle cord traction.  Pitocin infused rapidly IV per protocol. Fundus firm with massage.  Placenta inspected and appears to be intact with a 3 VC.  Placenta/Cord with the following complications: none. Cord pH: n/a Sponge and instrument count were correct x2.  Intrapartum complications:  None Anesthesia:  epidural Episiotomy: none Lacerations:  none Suture Repair:  n/a EBL (mL): 50   Infant: APGAR (1 MIN): 7   APGAR (5 MINS): 9   Infant weight: pending  Mom to postpartum.  Baby to Couplet care / Skin to Skin. Placenta to Pathology for cHTN    Plans to Bottlefeed Contraception: OCP (estrogen/progesterone) Circumcision: N/A  Note sent to [facility]: [place] for pp visit.   Brand Males CNM 07/28/2021 5:54 PM

## 2021-02-18 ENCOUNTER — Encounter: Payer: Self-pay | Admitting: Obstetrics and Gynecology

## 2021-02-18 ENCOUNTER — Other Ambulatory Visit: Payer: Self-pay

## 2021-02-18 ENCOUNTER — Ambulatory Visit (INDEPENDENT_AMBULATORY_CARE_PROVIDER_SITE_OTHER): Payer: BC Managed Care – PPO | Admitting: Obstetrics and Gynecology

## 2021-02-18 VITALS — BP 145/84 | HR 96 | Wt 176.0 lb

## 2021-02-18 DIAGNOSIS — O10919 Unspecified pre-existing hypertension complicating pregnancy, unspecified trimester: Secondary | ICD-10-CM

## 2021-02-18 DIAGNOSIS — Z23 Encounter for immunization: Secondary | ICD-10-CM

## 2021-02-18 DIAGNOSIS — O0990 Supervision of high risk pregnancy, unspecified, unspecified trimester: Secondary | ICD-10-CM

## 2021-02-18 DIAGNOSIS — Z3A16 16 weeks gestation of pregnancy: Secondary | ICD-10-CM | POA: Diagnosis not present

## 2021-02-18 DIAGNOSIS — O26899 Other specified pregnancy related conditions, unspecified trimester: Secondary | ICD-10-CM

## 2021-02-18 DIAGNOSIS — Z36 Encounter for antenatal screening for chromosomal anomalies: Secondary | ICD-10-CM | POA: Diagnosis not present

## 2021-02-18 DIAGNOSIS — Z6791 Unspecified blood type, Rh negative: Secondary | ICD-10-CM

## 2021-02-18 DIAGNOSIS — O09521 Supervision of elderly multigravida, first trimester: Secondary | ICD-10-CM

## 2021-02-18 NOTE — Progress Notes (Signed)
° °  PRENATAL VISIT NOTE  Subjective:  Anne Phelps is a 36 y.o. G2P1001 at [redacted]w[redacted]d being seen today for ongoing prenatal care.  She is currently monitored for the following issues for this high-risk pregnancy and has Rh negative state in antepartum period; Psoriasis; BMI 25.0-25.9,adult; Elevated blood pressure reading; Supervision of high risk pregnancy, antepartum; Vaginal bleeding affecting early pregnancy; Chronic hypertension affecting pregnancy; and Multigravida of advanced maternal age in first trimester on their problem list.  Patient reports no complaints.  Contractions: Not present. Vag. Bleeding: None.  Movement: Present. Denies leaking of fluid.   The following portions of the patient's history were reviewed and updated as appropriate: allergies, current medications, past family history, past medical history, past social history, past surgical history and problem list.   Objective:   Vitals:   02/18/21 0955  BP: (!) 145/84  Pulse: 96  Weight: 176 lb (79.8 kg)    Fetal Status: Fetal Heart Rate (bpm): 146   Movement: Present     General:  Alert, oriented and cooperative. Patient is in no acute distress.  Skin: Skin is warm and dry. No rash noted.   Cardiovascular: Normal heart rate noted  Respiratory: Normal respiratory effort, no problems with respiration noted  Abdomen: Soft, gravid, appropriate for gestational age.  Pain/Pressure: Absent     Pelvic: Cervical exam deferred        Extremities: Normal range of motion.  Edema: None  Mental Status: Normal mood and affect. Normal behavior. Normal judgment and thought content.   Assessment and Plan:  Pregnancy: G2P1001 at [redacted]w[redacted]d 1. Supervision of high risk pregnancy, antepartum Anatomy u/s already scheduled for 1/25 - AFP, Serum, Open Spina Bifida  2. Flu vaccine need - Flu Vaccine QUAD 33mo+IM (Fluarix, Fluzone & Alfiuria Quad PF)  3. Chronic hypertension affecting pregnancy On no meds currently. Babyscripts log reviewed  and they are all very norml in the 110s/70s range. I told her that we'll make a BP check visit for her to bring her home bp cuff to correlate with ours. Continue on low dose asa  4. Rh negative state in antepartum period  5. Multigravida of advanced maternal age in first trimester S/p low risk panorama  6. [redacted] weeks gestation of pregnancy - AFP, Serum, Open Spina Bifida  Preterm labor symptoms and general obstetric precautions including but not limited to vaginal bleeding, contractions, leaking of fluid and fetal movement were reviewed in detail with the patient. Please refer to After Visit Summary for other counseling recommendations.   Return in about 1 month (around 03/21/2021) for in person, md visit, high risk ob.  Future Appointments  Date Time Provider Department Center  03/10/2021  8:45 AM [facility] NURSE [facility]  03/10/2021  9:00 AM [facility] US1 [facility]  03/18/2021  8:55 AM [place] Bing, MD [facility]    [place] Bing, MD

## 2021-02-18 NOTE — Progress Notes (Signed)
ROB [redacted]w[redacted]d  *pt states she has White coat and that B/P have been WNL at home.   Pt had concerns if ok to take aspirin and Tylenol.

## 2021-02-20 LAB — AFP, SERUM, OPEN SPINA BIFIDA
AFP MoM: 0.74
AFP Value: 23.9 ng/mL
Gest. Age on Collection Date: 16.1 weeks
Maternal Age At EDD: 36.8 yr
OSBR Risk 1 IN: 10000
Test Results:: NEGATIVE
Weight: 176 [lb_av]

## 2021-03-10 ENCOUNTER — Ambulatory Visit: Payer: BC Managed Care – PPO | Admitting: *Deleted

## 2021-03-10 ENCOUNTER — Other Ambulatory Visit: Payer: Self-pay

## 2021-03-10 ENCOUNTER — Other Ambulatory Visit: Payer: Self-pay | Admitting: *Deleted

## 2021-03-10 ENCOUNTER — Encounter: Payer: Self-pay | Admitting: *Deleted

## 2021-03-10 ENCOUNTER — Ambulatory Visit: Payer: BC Managed Care – PPO | Attending: Obstetrics and Gynecology

## 2021-03-10 VITALS — BP 137/85 | HR 73

## 2021-03-10 DIAGNOSIS — O0990 Supervision of high risk pregnancy, unspecified, unspecified trimester: Secondary | ICD-10-CM | POA: Insufficient documentation

## 2021-03-10 DIAGNOSIS — O0991 Supervision of high risk pregnancy, unspecified, first trimester: Secondary | ICD-10-CM | POA: Diagnosis not present

## 2021-03-10 DIAGNOSIS — Z362 Encounter for other antenatal screening follow-up: Secondary | ICD-10-CM

## 2021-03-10 DIAGNOSIS — O09522 Supervision of elderly multigravida, second trimester: Secondary | ICD-10-CM

## 2021-03-18 ENCOUNTER — Other Ambulatory Visit: Payer: Self-pay

## 2021-03-18 ENCOUNTER — Ambulatory Visit (INDEPENDENT_AMBULATORY_CARE_PROVIDER_SITE_OTHER): Payer: BC Managed Care – PPO | Admitting: Obstetrics and Gynecology

## 2021-03-18 VITALS — BP 126/87 | Wt 178.0 lb

## 2021-03-18 DIAGNOSIS — Z3A20 20 weeks gestation of pregnancy: Secondary | ICD-10-CM

## 2021-03-18 DIAGNOSIS — O10919 Unspecified pre-existing hypertension complicating pregnancy, unspecified trimester: Secondary | ICD-10-CM

## 2021-03-18 DIAGNOSIS — O09521 Supervision of elderly multigravida, first trimester: Secondary | ICD-10-CM

## 2021-03-18 DIAGNOSIS — Z6791 Unspecified blood type, Rh negative: Secondary | ICD-10-CM

## 2021-03-18 DIAGNOSIS — O26899 Other specified pregnancy related conditions, unspecified trimester: Secondary | ICD-10-CM

## 2021-03-18 DIAGNOSIS — O0990 Supervision of high risk pregnancy, unspecified, unspecified trimester: Secondary | ICD-10-CM

## 2021-03-18 NOTE — Progress Notes (Signed)
° °  PRENATAL VISIT NOTE  Subjective:  Anne Phelps is a 36 y.o. G2P1001 at [redacted]w[redacted]d being seen today for ongoing prenatal care.  She is currently monitored for the following issues for this high-risk pregnancy and has Rh negative state in antepartum period; Psoriasis; BMI 25.0-25.9,adult; Elevated blood pressure reading; Supervision of high risk pregnancy, antepartum; Vaginal bleeding affecting early pregnancy; Chronic hypertension affecting pregnancy; and Multigravida of advanced maternal age in first trimester on their problem list.  Patient reports no complaints.  Contractions: Not present. Vag. Bleeding: None.  Movement: Present. Denies leaking of fluid.   The following portions of the patient's history were reviewed and updated as appropriate: allergies, current medications, past family history, past medical history, past social history, past surgical history and problem list.   Objective:   Vitals:   03/18/21 0907  BP: 126/87  Weight: 178 lb (80.7 kg)    Fetal Status: Fetal Heart Rate (bpm): 166   Movement: Present     General:  Alert, oriented and cooperative. Patient is in no acute distress.  Skin: Skin is warm and dry. No rash noted.   Cardiovascular: Normal heart rate noted  Respiratory: Normal respiratory effort, no problems with respiration noted  Abdomen: Soft, gravid, appropriate for gestational age.  Pain/Pressure: Absent     Pelvic: Cervical exam deferred        Extremities: Normal range of motion.  Edema: None  Mental Status: Normal mood and affect. Normal behavior. Normal judgment and thought content.   Assessment and Plan:  Pregnancy: G2P1001 at [redacted]w[redacted]d 1. [redacted] weeks gestation of pregnancy  2. Chronic hypertension affecting pregnancy Doing well on no meds. Home BP cuff checked with ours today and correlates.  Continue qmonth growth u/s for cHTN 3. Supervision of high risk pregnancy, antepartum F/u completion anatomy u/s in one month.   4. Rh negative state in  antepartum period Rhogam, ab screen with GTT  5. Multigravida of advanced maternal age in first trimester No issues  Preterm labor symptoms and general obstetric precautions including but not limited to vaginal bleeding, contractions, leaking of fluid and fetal movement were reviewed in detail with the patient. Please refer to After Visit Summary for other counseling recommendations.   Return in about 3 weeks (around 04/08/2021) for in person or virtual, md or app, low risk ob.  Future Appointments  Date Time Provider [place]  04/08/2021  8:30 AM [facility] NURSE [facility]  04/08/2021  8:45 AM [facility] US4 [facility]    Aletha Halim, MD

## 2021-04-08 ENCOUNTER — Encounter: Payer: Self-pay | Admitting: *Deleted

## 2021-04-08 ENCOUNTER — Other Ambulatory Visit: Payer: Self-pay

## 2021-04-08 ENCOUNTER — Ambulatory Visit: Payer: BC Managed Care – PPO | Admitting: *Deleted

## 2021-04-08 ENCOUNTER — Ambulatory Visit: Payer: BC Managed Care – PPO | Attending: Obstetrics

## 2021-04-08 VITALS — BP 136/63 | HR 70

## 2021-04-08 DIAGNOSIS — Z362 Encounter for other antenatal screening follow-up: Secondary | ICD-10-CM | POA: Insufficient documentation

## 2021-04-08 DIAGNOSIS — O09522 Supervision of elderly multigravida, second trimester: Secondary | ICD-10-CM | POA: Diagnosis not present

## 2021-04-08 DIAGNOSIS — O10012 Pre-existing essential hypertension complicating pregnancy, second trimester: Secondary | ICD-10-CM

## 2021-04-08 DIAGNOSIS — O0990 Supervision of high risk pregnancy, unspecified, unspecified trimester: Secondary | ICD-10-CM | POA: Insufficient documentation

## 2021-04-08 DIAGNOSIS — Z3A23 23 weeks gestation of pregnancy: Secondary | ICD-10-CM | POA: Diagnosis not present

## 2021-04-15 ENCOUNTER — Telehealth (INDEPENDENT_AMBULATORY_CARE_PROVIDER_SITE_OTHER): Payer: BC Managed Care – PPO | Admitting: Obstetrics and Gynecology

## 2021-04-15 DIAGNOSIS — O0990 Supervision of high risk pregnancy, unspecified, unspecified trimester: Secondary | ICD-10-CM

## 2021-04-15 DIAGNOSIS — O09522 Supervision of elderly multigravida, second trimester: Secondary | ICD-10-CM

## 2021-04-15 DIAGNOSIS — O10912 Unspecified pre-existing hypertension complicating pregnancy, second trimester: Secondary | ICD-10-CM

## 2021-04-15 DIAGNOSIS — O360190 Maternal care for anti-D [Rh] antibodies, unspecified trimester, not applicable or unspecified: Secondary | ICD-10-CM

## 2021-04-15 DIAGNOSIS — O10919 Unspecified pre-existing hypertension complicating pregnancy, unspecified trimester: Secondary | ICD-10-CM

## 2021-04-15 DIAGNOSIS — O0992 Supervision of high risk pregnancy, unspecified, second trimester: Secondary | ICD-10-CM

## 2021-04-15 DIAGNOSIS — Z6791 Unspecified blood type, Rh negative: Secondary | ICD-10-CM

## 2021-04-15 DIAGNOSIS — Z3A24 24 weeks gestation of pregnancy: Secondary | ICD-10-CM

## 2021-04-15 NOTE — Progress Notes (Signed)
? ? ? ?  TELEHEALTH OBSTETRICS VISIT ENCOUNTER NOTE ? ?Provider location: Center for [employer] [facility] at [facility]  ? ?Patient location: Home ? ?I connected with Anne Phelps on 04/15/21 at  9:35 AM EST by telephone at home and verified that I am speaking with the correct person using two identifiers. Of note, unable to do video encounter due to technical difficulties.  ?  ?I discussed the limitations, risks, security and privacy concerns of performing an evaluation and management service by telephone and the availability of in person appointments. I also discussed with the patient that there may be a patient responsible charge related to this service. The patient expressed understanding and agreed to proceed. ? ?Subjective:  ?Anne Phelps is a 36 y.o. G2P1001 at [redacted]w[redacted]d being followed for ongoing prenatal care.  She is currently monitored for the following issues for this high-risk pregnancy and has Rh negative state in antepartum period; Psoriasis; BMI 25.0-25.9,adult; Elevated blood pressure reading; Supervision of high risk pregnancy, antepartum; Vaginal bleeding affecting early pregnancy; Chronic hypertension affecting pregnancy; and Multigravida of advanced maternal age in first trimester on their problem list. ? ?Patient reports no complaints. Reports fetal movement. Denies any contractions, bleeding or leaking of fluid.  ? ?The following portions of the patient's history were reviewed and updated as appropriate: allergies, current medications, past family history, past medical history, past social history, past surgical history and problem list.  ? ?Objective:  ?Last menstrual period 10/28/2020. ?General:  Alert, oriented and cooperative.   ?Mental Status: Normal mood and affect perceived. Normal judgment and thought content.  ?Rest of physical exam deferred due to type of encounter ? ?Assessment and Plan:  ?Pregnancy: G2P1001 at [redacted]w[redacted]d ?1. Chronic hypertension affecting pregnancy ?Doing well on no  meds. Babyscripts BPs reviewed and not close to 130/80. Normal growth u/s last week. Will set up for monthly surveillance scans ?- [place] MFM OB FOLLOW UP; Future ? ?2. Supervision of high risk pregnancy, antepartum ?28wk labs nv ? ?3. Rh negative state in antepartum period ?Rhogam, ab screen nv ? ?4. Multigravida of advanced maternal age in second trimester ?- [place] MFM OB FOLLOW UP; Future ? ?Preterm labor symptoms and general obstetric precautions including but not limited to vaginal bleeding, contractions, leaking of fluid and fetal movement were reviewed in detail with the patient.  ?I discussed the assessment and treatment plan with the patient. The patient was provided an opportunity to ask questions and all were answered. The patient agreed with the plan and demonstrated an understanding of the instructions. The patient was advised to call back or seek an in-person office evaluation/go to [facility] at [facility] for any urgent or concerning symptoms. ?Please refer to After Visit Summary for other counseling recommendations.  ? ?I provided 7 minutes of non-face-to-face time during this encounter. ? ?Return in about 3 weeks (around 05/06/2021) for high risk ob, md or app, in person, fasting 2hr GTT. ? ?Future Appointments  ?Date Time Provider [place]  ?05/13/2021  8:35 AM Anyanwu, Sallyanne Havers, MD [facility]  ?05/27/2021  8:35 AM Aletha Halim, MD [facility]  ?06/10/2021  8:35 AM Anyanwu, Sallyanne Havers, MD [facility]  ?06/23/2021  8:35 AM Donnamae Jude, MD [facility]  ?07/06/2021  8:35 AM Aletha Halim, MD [facility]  ? ? ?Aletha Halim, [place] for [place] ? ? ? ?

## 2021-05-13 ENCOUNTER — Ambulatory Visit: Payer: BC Managed Care – PPO

## 2021-05-13 ENCOUNTER — Encounter: Payer: Self-pay | Admitting: Obstetrics & Gynecology

## 2021-05-13 ENCOUNTER — Ambulatory Visit (INDEPENDENT_AMBULATORY_CARE_PROVIDER_SITE_OTHER): Payer: BC Managed Care – PPO | Admitting: Obstetrics & Gynecology

## 2021-05-13 VITALS — BP 136/83 | HR 78 | Wt 184.0 lb

## 2021-05-13 DIAGNOSIS — O99013 Anemia complicating pregnancy, third trimester: Secondary | ICD-10-CM

## 2021-05-13 DIAGNOSIS — O0990 Supervision of high risk pregnancy, unspecified, unspecified trimester: Secondary | ICD-10-CM

## 2021-05-13 DIAGNOSIS — O09523 Supervision of elderly multigravida, third trimester: Secondary | ICD-10-CM

## 2021-05-13 DIAGNOSIS — O10919 Unspecified pre-existing hypertension complicating pregnancy, unspecified trimester: Secondary | ICD-10-CM | POA: Diagnosis not present

## 2021-05-13 DIAGNOSIS — Z23 Encounter for immunization: Secondary | ICD-10-CM

## 2021-05-13 DIAGNOSIS — O26899 Other specified pregnancy related conditions, unspecified trimester: Secondary | ICD-10-CM

## 2021-05-13 DIAGNOSIS — Z3A28 28 weeks gestation of pregnancy: Secondary | ICD-10-CM | POA: Diagnosis not present

## 2021-05-13 DIAGNOSIS — Z6791 Unspecified blood type, Rh negative: Secondary | ICD-10-CM | POA: Diagnosis not present

## 2021-05-13 MED ORDER — RHO D IMMUNE GLOBULIN 1500 UNIT/2ML IJ SOSY
300.0000 ug | PREFILLED_SYRINGE | Freq: Once | INTRAMUSCULAR | Status: AC
  Administered 2021-05-13: 300 ug via INTRAMUSCULAR

## 2021-05-13 NOTE — Progress Notes (Signed)
? ?  PRENATAL VISIT NOTE ? ?Subjective:  ?Anne Phelps is a 36 y.o. G2P1001 at [redacted]w[redacted]d being seen today for ongoing prenatal care.  She is currently monitored for the following issues for this high-risk pregnancy and has Rh negative state in antepartum period; Psoriasis; BMI 25.0-25.9,adult; Supervision of high risk pregnancy, antepartum; Vaginal bleeding affecting early pregnancy; Chronic hypertension affecting pregnancy; and Multigravida of advanced maternal age in third trimester on their problem list. ? ?Patient reports no complaints.  Contractions: Not present. Vag. Bleeding: None.  Movement: Present. Denies leaking of fluid.  ? ?The following portions of the patient's history were reviewed and updated as appropriate: allergies, current medications, past family history, past medical history, past social history, past surgical history and problem list.  ? ?Objective:  ? ?Vitals:  ? 05/13/21 0844  ?BP: 136/83  ?Pulse: 78  ?Weight: 184 lb (83.5 kg)  ? ? ?Fetal Status: Fetal Heart Rate (bpm): 137 Fundal Height: 28 cm Movement: Present    ? ?General:  Alert, oriented and cooperative. Patient is in no acute distress.  ?Skin: Skin is warm and dry. No rash noted.   ?Cardiovascular: Normal heart rate noted  ?Respiratory: Normal respiratory effort, no problems with respiration noted  ?Abdomen: Soft, gravid, appropriate for gestational age.  Pain/Pressure: Absent     ?Pelvic: Cervical exam deferred        ?Extremities: Normal range of motion.  Edema: None  ?Mental Status: Normal mood and affect. Normal behavior. Normal judgment and thought content.  ? ? ?Assessment and Plan:  ?Pregnancy: G2P1001 at [redacted]w[redacted]d ?1. Chronic hypertension affecting pregnancy ?Surveillance labs added to other labs today.  ?- CBC ?- Comprehensive metabolic panel ?- Protein / creatinine ratio, urine ? ?2. Rh negative state in antepartum period ?Rhogam given today ?- Antibody screen ?- rho (d) immune globulin (RHIG/RHOPHYLAC) injection 300 mcg ? ?3.  Need for Tdap vaccination ?- Tdap vaccine greater than or equal to 7yo IM given today ? ?4. Multigravida of advanced maternal age in third trimester ?5. [redacted] weeks gestation of pregnancy ?6. Supervision of high risk pregnancy, antepartum ?Third trimester labs today.  ?- Glucose Tolerance, 2 Hours w/1 Hour ?- CBC ?- RPR ?- HIV Antibody (routine testing w rflx) ?Preterm labor symptoms and general obstetric precautions including but not limited to vaginal bleeding, contractions, leaking of fluid and fetal movement were reviewed in detail with the patient. ?Please refer to After Visit Summary for other counseling recommendations.  ? ?Return in about 2 weeks (around 05/27/2021) for OFFICE OB VISIT (MD or APP). ? ?Future Appointments  ?Date Time Provider [place]  ?05/27/2021  8:35 AM Aletha Halim, MD [facility]  ?05/28/2021 10:45 AM [facility] NURSE [facility]  ?05/28/2021 11:00 AM [facility] US1 [facility]  ?06/10/2021  8:35 AM Michaell Grider, Sallyanne Havers, MD [facility]  ?06/24/2021  8:35 AM Lendell Gallick, Sallyanne Havers, MD [facility]  ?07/08/2021  8:35 AM Rena Sweeden, Sallyanne Havers, MD [facility]  ? ? ?Verita Schneiders, MD ? ?

## 2021-05-14 LAB — CBC
Hematocrit: 30.9 % — ABNORMAL LOW (ref 34.0–46.6)
Hemoglobin: 10.4 g/dL — ABNORMAL LOW (ref 11.1–15.9)
MCH: 29.3 pg (ref 26.6–33.0)
MCHC: 33.7 g/dL (ref 31.5–35.7)
MCV: 87 fL (ref 79–97)
Platelets: 257 10*3/uL (ref 150–450)
RBC: 3.55 x10E6/uL — ABNORMAL LOW (ref 3.77–5.28)
RDW: 12.3 % (ref 11.7–15.4)
WBC: 11.9 10*3/uL — ABNORMAL HIGH (ref 3.4–10.8)

## 2021-05-14 LAB — COMPREHENSIVE METABOLIC PANEL
ALT: 12 IU/L (ref 0–32)
AST: 16 IU/L (ref 0–40)
Albumin/Globulin Ratio: 1.8 (ref 1.2–2.2)
Albumin: 4 g/dL (ref 3.8–4.8)
Alkaline Phosphatase: 76 IU/L (ref 44–121)
BUN/Creatinine Ratio: 13 (ref 9–23)
BUN: 7 mg/dL (ref 6–20)
Bilirubin Total: <0.2 mg/dL (ref 0.0–1.2)
CO2: 22 mmol/L (ref 20–29)
Calcium: 9.1 mg/dL (ref 8.7–10.2)
Chloride: 102 mmol/L (ref 96–106)
Creatinine, Ser: 0.53 mg/dL — ABNORMAL LOW (ref 0.57–1.00)
Globulin, Total: 2.2 g/dL (ref 1.5–4.5)
Glucose: 81 mg/dL (ref 70–99)
Potassium: 3.9 mmol/L (ref 3.5–5.2)
Sodium: 138 mmol/L (ref 134–144)
Total Protein: 6.2 g/dL (ref 6.0–8.5)
eGFR: 123 mL/min/{1.73_m2} (ref >59)

## 2021-05-14 LAB — ANTIBODY SCREEN: Antibody Screen: NEGATIVE

## 2021-05-14 LAB — GLUCOSE TOLERANCE, 2 HOURS W/ 1HR
Glucose, 1 hour: 182 mg/dL — ABNORMAL HIGH (ref 70–179)
Glucose, 2 hour: 176 mg/dL — ABNORMAL HIGH (ref 70–152)
Glucose, Fasting: 81 mg/dL (ref 70–91)

## 2021-05-14 LAB — PROTEIN / CREATININE RATIO, URINE
Creatinine, Urine: 137.8 mg/dL
Protein, Ur: 11.8 mg/dL
Protein/Creat Ratio: 86 mg/g creat (ref 0–200)

## 2021-05-14 LAB — RPR: RPR Ser Ql: NONREACTIVE

## 2021-05-14 LAB — HIV ANTIBODY (ROUTINE TESTING W REFLEX): HIV Screen 4th Generation wRfx: NONREACTIVE

## 2021-05-17 ENCOUNTER — Encounter: Payer: Self-pay | Admitting: Obstetrics & Gynecology

## 2021-05-17 ENCOUNTER — Telehealth: Payer: Self-pay | Admitting: Emergency Medicine

## 2021-05-17 MED ORDER — FERROUS GLUCONATE 324 (38 FE) MG PO TABS: 324.0000 mg | ORAL_TABLET | Freq: Every day | ORAL | 3 refills | Status: DC

## 2021-05-17 NOTE — Telephone Encounter (Signed)
TC to patient to discuss gtt results and next steps. Pt states that she may want to retest, because she believes that her shots prior to taking her gtt could have contributed to blood sugar spikes. Pt states it happened with previous pregnancy. This RN reassured pt that she would discuss with provider and follow up.  ?

## 2021-05-17 NOTE — Addendum Note (Signed)
Addended by: Jaynie Collins A on: 05/17/2021 12:16 PM ? ? Modules accepted: Orders ? ?

## 2021-05-18 ENCOUNTER — Other Ambulatory Visit: Payer: Self-pay | Admitting: *Deleted

## 2021-05-18 DIAGNOSIS — O24410 Gestational diabetes mellitus in pregnancy, diet controlled: Secondary | ICD-10-CM

## 2021-05-18 MED ORDER — ACCU-CHEK GUIDE W/DEVICE KIT: 1.0000 | PACK | Freq: Four times a day (QID) | 0 refills | Status: DC

## 2021-05-18 MED ORDER — ACCU-CHEK GUIDE VI STRP: ORAL_STRIP | 12 refills | Status: DC

## 2021-05-18 MED ORDER — ACCU-CHEK SOFTCLIX LANCETS MISC: 1.0000 | Freq: Four times a day (QID) | 12 refills | Status: DC

## 2021-05-19 ENCOUNTER — Encounter: Payer: Self-pay | Admitting: Registered"

## 2021-05-19 ENCOUNTER — Encounter: Payer: BC Managed Care – PPO | Attending: Obstetrics & Gynecology | Admitting: Registered"

## 2021-05-19 DIAGNOSIS — O24419 Gestational diabetes mellitus in pregnancy, unspecified control: Secondary | ICD-10-CM | POA: Insufficient documentation

## 2021-05-19 DIAGNOSIS — O24410 Gestational diabetes mellitus in pregnancy, diet controlled: Secondary | ICD-10-CM | POA: Insufficient documentation

## 2021-05-19 DIAGNOSIS — Z8632 Personal history of gestational diabetes: Secondary | ICD-10-CM | POA: Insufficient documentation

## 2021-05-19 NOTE — Progress Notes (Signed)
Patient was seen on 05/19/21 for Gestational Diabetes self-management class at the [facility]. The following learning objectives were met by the patient during this course: ? ?States the definition of Gestational Diabetes ?States why dietary management is important in controlling blood glucose ?Describes the effects each nutrient has on blood glucose levels ?Demonstrates ability to create a balanced meal plan ?Demonstrates carbohydrate counting  ?States when to check blood glucose levels ?Demonstrates proper blood glucose monitoring techniques ?States the effect of stress and exercise on blood glucose levels ?States the importance of limiting caffeine and abstaining from alcohol and smoking ? ?Blood glucose monitor given: Patient has meter and brought to class for instruction. ?Blood glucose reading: 105 mg/dL (breakfast PPBG) ? ?Patient instructed to monitor glucose levels: ?FBS: 60 - <95; 1 hour: <140; 2 hour: <120 ? ?Patient received handouts: ?Nutrition Diabetes and Pregnancy, including carb counting list ? ?Patient will be seen for follow-up as needed. ?

## 2021-05-25 ENCOUNTER — Other Ambulatory Visit: Payer: Self-pay | Admitting: Obstetrics & Gynecology

## 2021-05-26 MED ORDER — ONETOUCH VERIO VI STRP: ORAL_STRIP | 12 refills | Status: DC

## 2021-05-26 MED ORDER — ONETOUCH CLUB LANCETS FINE PT MISC: 1.0000 | 1 refills | Status: DC

## 2021-05-27 ENCOUNTER — Encounter: Payer: Self-pay | Admitting: Obstetrics and Gynecology

## 2021-05-27 ENCOUNTER — Ambulatory Visit (INDEPENDENT_AMBULATORY_CARE_PROVIDER_SITE_OTHER): Payer: BC Managed Care – PPO | Admitting: Obstetrics and Gynecology

## 2021-05-27 VITALS — BP 128/84 | HR 83 | Wt 180.0 lb

## 2021-05-27 DIAGNOSIS — O0990 Supervision of high risk pregnancy, unspecified, unspecified trimester: Secondary | ICD-10-CM

## 2021-05-27 DIAGNOSIS — O24410 Gestational diabetes mellitus in pregnancy, diet controlled: Secondary | ICD-10-CM

## 2021-05-27 DIAGNOSIS — O10919 Unspecified pre-existing hypertension complicating pregnancy, unspecified trimester: Secondary | ICD-10-CM

## 2021-05-27 DIAGNOSIS — Z3A30 30 weeks gestation of pregnancy: Secondary | ICD-10-CM

## 2021-05-27 DIAGNOSIS — Z6791 Unspecified blood type, Rh negative: Secondary | ICD-10-CM

## 2021-05-27 DIAGNOSIS — O09523 Supervision of elderly multigravida, third trimester: Secondary | ICD-10-CM

## 2021-05-27 DIAGNOSIS — O26899 Other specified pregnancy related conditions, unspecified trimester: Secondary | ICD-10-CM

## 2021-05-27 NOTE — Progress Notes (Signed)
? ?  PRENATAL VISIT NOTE ? ?Subjective:  ?Anne Phelps is a 36 y.o. G2P1001 at [redacted]w[redacted]d being seen today for ongoing prenatal care.  She is currently monitored for the following issues for this high-risk pregnancy and has Rh negative state in antepartum period; Psoriasis; BMI 25.0-25.9,adult; Supervision of high risk pregnancy, antepartum; Vaginal bleeding affecting early pregnancy; Chronic hypertension affecting pregnancy; Multigravida of advanced maternal age in third trimester; and Gestational diabetes mellitus (GDM), antepartum on their problem list. ? ?Patient reports  dry mouth .  Contractions: Not present. Vag. Bleeding: None.  Movement: Present. Denies leaking of fluid.  ? ?The following portions of the patient's history were reviewed and updated as appropriate: allergies, current medications, past family history, past medical history, past social history, past surgical history and problem list.  ? ?Objective:  ? ?Vitals:  ? 05/27/21 0847 05/27/21 0857  ?BP: (!) 147/83 128/84  ?Pulse: 83   ?Weight: 180 lb (81.6 kg)   ? ? ?Fetal Status: Fetal Heart Rate (bpm): 156 Fundal Height: 31 cm Movement: Present    ? ?General:  Alert, oriented and cooperative. Patient is in no acute distress.  ?Skin: Skin is warm and dry. No rash noted.   ?Cardiovascular: Normal heart rate noted  ?Respiratory: Normal respiratory effort, no problems with respiration noted  ?Abdomen: Soft, gravid, appropriate for gestational age.  Pain/Pressure: Present     ?Pelvic: Cervical exam deferred        ?Extremities: Normal range of motion.  Edema: None  ?Mental Status: Normal mood and affect. Normal behavior. Normal judgment and thought content.  ? ?Assessment and Plan:  ?Pregnancy: G2P1001 at [redacted]w[redacted]d ?1. Supervision of high risk pregnancy, antepartum ?Routine care. D/w pt more re: BC nv ?Has surveillance growth tomorrow. I told her that she'll need another one in a month and will pin down a delivery plan at that time ? ?2. Chronic hypertension  affecting pregnancy ?Doing well on no meds ? ?3. Diet controlled gestational diabetes mellitus (GDM), antepartum ?Normal CBG log today on no meds ? ?4. Rh negative state in antepartum period ?S/p rhogam already ? ?5. Multigravida of advanced maternal age in third trimester ? ?6. [redacted] weeks gestation of pregnancy ? ?Preterm labor symptoms and general obstetric precautions including but not limited to vaginal bleeding, contractions, leaking of fluid and fetal movement were reviewed in detail with the patient. ?Please refer to After Visit Summary for other counseling recommendations.  ? ?Return in about 2 weeks (around 06/10/2021) for in person, md or app, high risk ob. ? ?Future Appointments  ?Date Time Provider [place]  ?05/28/2021 10:45 AM [facility] NURSE [facility]  ?05/28/2021 11:00 AM [facility] US1 [facility]  ?06/10/2021  8:35 AM Anyanwu, Sallyanne Havers, MD [facility]  ?06/24/2021  8:35 AM Anyanwu, Sallyanne Havers, MD [facility]  ?07/08/2021  8:35 AM Anyanwu, Sallyanne Havers, MD [facility]  ? ? ?Aletha Halim, MD ? ?

## 2021-05-27 NOTE — Progress Notes (Signed)
ROB [redacted]w[redacted]d ?2hr GTT was abnormal on 05/13/21 ?Pt brought sugar log today. ?Rhogam given 05/13/21 ?B/P elevated pt denies any HA, no visual changes ? ? ?Repeated B/P 128/84 P: 84 ? ?CC: Dry skin, dry mouth in the morning drinking 96 oz of water a day. ? ? ?

## 2021-05-28 ENCOUNTER — Ambulatory Visit: Payer: BC Managed Care – PPO | Admitting: *Deleted

## 2021-05-28 ENCOUNTER — Encounter: Payer: Self-pay | Admitting: *Deleted

## 2021-05-28 ENCOUNTER — Ambulatory Visit: Payer: BC Managed Care – PPO | Attending: Obstetrics and Gynecology

## 2021-05-28 ENCOUNTER — Other Ambulatory Visit: Payer: Self-pay | Admitting: *Deleted

## 2021-05-28 VITALS — BP 135/65 | HR 80

## 2021-05-28 DIAGNOSIS — O24410 Gestational diabetes mellitus in pregnancy, diet controlled: Secondary | ICD-10-CM

## 2021-05-28 DIAGNOSIS — O10913 Unspecified pre-existing hypertension complicating pregnancy, third trimester: Secondary | ICD-10-CM

## 2021-05-28 DIAGNOSIS — O10919 Unspecified pre-existing hypertension complicating pregnancy, unspecified trimester: Secondary | ICD-10-CM | POA: Diagnosis not present

## 2021-05-28 DIAGNOSIS — O09522 Supervision of elderly multigravida, second trimester: Secondary | ICD-10-CM | POA: Diagnosis not present

## 2021-05-28 DIAGNOSIS — O0990 Supervision of high risk pregnancy, unspecified, unspecified trimester: Secondary | ICD-10-CM | POA: Insufficient documentation

## 2021-05-28 DIAGNOSIS — O09523 Supervision of elderly multigravida, third trimester: Secondary | ICD-10-CM

## 2021-06-10 ENCOUNTER — Encounter: Payer: Self-pay | Admitting: Obstetrics & Gynecology

## 2021-06-10 ENCOUNTER — Ambulatory Visit (INDEPENDENT_AMBULATORY_CARE_PROVIDER_SITE_OTHER): Payer: BC Managed Care – PPO | Admitting: Obstetrics & Gynecology

## 2021-06-10 VITALS — BP 133/82 | HR 76 | Wt 181.0 lb

## 2021-06-10 DIAGNOSIS — Z3A32 32 weeks gestation of pregnancy: Secondary | ICD-10-CM

## 2021-06-10 DIAGNOSIS — O26899 Other specified pregnancy related conditions, unspecified trimester: Secondary | ICD-10-CM

## 2021-06-10 DIAGNOSIS — O24410 Gestational diabetes mellitus in pregnancy, diet controlled: Secondary | ICD-10-CM

## 2021-06-10 DIAGNOSIS — O10919 Unspecified pre-existing hypertension complicating pregnancy, unspecified trimester: Secondary | ICD-10-CM

## 2021-06-10 DIAGNOSIS — O09523 Supervision of elderly multigravida, third trimester: Secondary | ICD-10-CM

## 2021-06-10 DIAGNOSIS — Z6791 Unspecified blood type, Rh negative: Secondary | ICD-10-CM

## 2021-06-10 DIAGNOSIS — O0990 Supervision of high risk pregnancy, unspecified, unspecified trimester: Secondary | ICD-10-CM

## 2021-06-10 NOTE — Patient Instructions (Signed)
Return to office for any scheduled appointments. Call the office or go to the MAU at [facility] [place] if: You begin to have strong, frequent contractions Your water breaks.  Sometimes it is a big gush of fluid, sometimes it is just a trickle that keeps getting your underwear wet or running down your legs You have vaginal bleeding.  It is normal to have a small amount of spotting if your cervix was checked.  You do not feel your baby moving like normal.  If you do not, get something to eat and drink and lay down and focus on feeling your baby move.   If your baby is still not moving like normal, you should call the office or go to MAU. Any other obstetric concerns.  

## 2021-06-10 NOTE — Progress Notes (Signed)
? ?PRENATAL VISIT NOTE ? ?Subjective:  ?Anne Phelps is a 36 y.o. G2P1001 at [redacted]w[redacted]d being seen today for ongoing prenatal care.  She is currently monitored for the following issues for this high-risk pregnancy and has Rh negative state in antepartum period; BMI 25.0-25.9,adult; Supervision of high risk pregnancy, antepartum; Chronic hypertension affecting pregnancy; Multigravida of advanced maternal age in third trimester; and Gestational diabetes mellitus (GDM), antepartum on their problem list. ? ?Patient reports no complaints.  Contractions: Not present. Vag. Bleeding: None.  Movement: Present. Denies leaking of fluid.  ? ?The following portions of the patient's history were reviewed and updated as appropriate: allergies, current medications, past family history, past medical history, past social history, past surgical history and problem list.  ? ?Objective:  ? ?Vitals:  ? 06/10/21 0849  ?BP: 133/82  ?Pulse: 76  ?Weight: 181 lb (82.1 kg)  ? ? ?Fetal Status: Fetal Heart Rate (bpm): 145   Movement: Present    ? ?General:  Alert, oriented and cooperative. Patient is in no acute distress.  ?Skin: Skin is warm and dry. No rash noted.   ?Cardiovascular: Normal heart rate noted  ?Respiratory: Normal respiratory effort, no problems with respiration noted  ?Abdomen: Soft, gravid, appropriate for gestational age.  Pain/Pressure: Absent     ?Pelvic: Cervical exam deferred        ?Extremities: Normal range of motion.  Edema: None  ?Mental Status: Normal mood and affect. Normal behavior. Normal judgment and thought content.  ? ?Imaging: ?[place] MFM OB FOLLOW UP ? ?Result Date: 05/28/2021 ?----------------------------------------------------------------------  OBSTETRICS REPORT                       (Signed Final 05/28/2021 04:15 pm) ---------------------------------------------------------------------- Patient Info  ID #:       [id number]                          D.O.B.:  1985-12-18 (35 yrs)  Name:       Anne Phelps                   Visit Date: 05/28/2021 11:58 am ---------------------------------------------------------------------- Performed By  Attending:        Sander Nephew      Ref. Address:     [place]  Performed By:     Jacob Moores BS,       [facility]  Referred By:      [facility] ---------------------------------------------------------------------- Orders  #  Description                           Code        Ordered By  1  [place] MFM OB FOLLOW UP                   [phone]    Aletha Halim ----------------------------------------------------------------------  #  Order #                     Accession #                Episode #  1  [id number]                   [id number]                 [id number] ---------------------------------------------------------------------- Indications  [redacted] weeks gestation of pregnancy                Z3A.30  Hypertension - Chronic/Pre-existing (No        O10.019  meds)  Advanced maternal age multigravida 22+,        O59.523  third trimester (33 yrs)  LOW risk NIPS  /  Negative AFP  Gestational diabetes in pregnancy, diet        O24.410  controlled ---------------------------------------------------------------------- Fetal Evaluation  Num Of Fetuses:         1  Fetal Heart Rate(bpm):  145  Cardiac Activity:       Observed  Presentation:           Breech  Placenta:               Anterior  P. Cord Insertion:      Previously Visualized  Amniotic Fluid  AFI FV:      Within normal limits  AFI Sum(cm)     %Tile       Largest Pocket(cm)  21.2            83          7  RUQ(cm)       RLQ(cm)       LUQ(cm)        LLQ(cm)  4.5           4.4           7              5.3  ---------------------------------------------------------------------- Biometry  BPD:      76.7  mm     G. Age:  30w 5d         54  %    CI:        72.33   %    70 - 86  FL/HC:      20.1   %    19.2 - 21.4  HC:      286.9  mm     G. Age:  31w 4d         50  %    HC/AC:      1.10        0.99 - 1.21  AC:      260.5  mm     G. Age:  30w 1d         42  %    FL/BPD:     75.1   %    71 - 87  FL:       57.6  mm     G. Age:  30w 1d         32  %    FL/AC:      22.1   %    20 - 24  CER:      36.5  mm     G. Age:  30w 2d         32  %  LV:        5.4  mm  CM:        6.8  mm  Est. FW:    1558  gm      3 lb 7 oz     39  % ---------------------------------------------------------------------- OB History  Gravidity:    2         Term:   1        Prem:   0        SAB:   0  TOP:          0       Ectopic:  0        Living: 1 ---------------------------------------------------------------------- Gestational Age  LMP:           30w 2d        Date:  10/28/20                 EDD:   08/04/21  U/S Today:     30w 5d                                        EDD:   08/01/21  Best:          30w 2d     Det. By:  LMP  (10/28/20)          EDD:   08/04/21 ---------------------------------------------------------------------- Anatomy  Cranium:               Appears normal         LVOT:                   Previously seen  Cavum:                 Appears normal         Aortic Arch:            Previously seen  Ventricles:            Appears normal         Ductal Arch:            Previously seen  Choroid Plexus:        Previously seen  Diaphragm:              Appears normal  Cerebellum:            Appears normal         Stomach:                Appears normal, left                                                                        sided  Posterior Fossa:       Previously seen        Abdomen:                Previously seen  Nuchal Fold:           Not applicable ([id number]    Abdominal Wall:          Previously seen                         wks GA)  Face:                  Orbits and profile     Cord Vessels:           Previously seen                         previously seen  Lips:                  Previously seen        Kidneys:                Appear normal  Palate:                Previously seen        Bladder:                Appears normal  Thoracic:              Appears normal         Spine:                  Previously seen  Heart:                 Appears normal         Upper Extremities:      Previously seen                         (4CH, axis, and                         situs)  RVOT:                  Previously seen        Lower Extremities:      Previously seen  Other:  VC, 3VV and 3VTV visualized. Fetus appears to be female. Heels          and 5th digit, nasal bone previously visualized. Technically difficult          due to fetal position. ---------------------------------------------------------------------- Cervix Uterus Adnexa  Cervix  Normal appearance by transabdominal scan.  Uterus  Normal shape and size.  Right Ovary  Within normal limits.  Left Ovary  Within normal limits.  Cul De Sac  No free fluid seen.  Adnexa  No adnexal mass visualized. ---------------------------------------------------------------------- Impression  Follow up growth due to A1GDM and chronic hypertension no  meds.  Normal interval growth with measurements consistent with  dates  Good fetal movement and amniotic fluid volume ---------------------------------------------------------------------- Recommendations  Repeat growth in 4 weeks. ----------------------------------------------------------------------               Sander Nephew, MD Electronically Signed Final Report   05/28/2021 04:15 pm ----------------------------------------------------------------------  ? ?Assessment and Plan:  ?Pregnancy: G2P1001 at [redacted]w[redacted]d ?1. Diet controlled gestational diabetes mellitus (GDM), antepartum ?Normal CBGs, continue diet and  exercise control. Next growth scan is 06/25/2021 ? ?2. Chronic hypertension affecting pregnancy ?Stable BP, no meds. Next growth scan is 06/25/2021 ? ?3. Multigravida of advanced maternal age in third trimester ?[place]

## 2021-06-23 ENCOUNTER — Encounter: Payer: BC Managed Care – PPO | Admitting: Family Medicine

## 2021-06-24 ENCOUNTER — Encounter: Payer: Self-pay | Admitting: Obstetrics & Gynecology

## 2021-06-24 ENCOUNTER — Ambulatory Visit (INDEPENDENT_AMBULATORY_CARE_PROVIDER_SITE_OTHER): Payer: BC Managed Care – PPO | Admitting: Obstetrics & Gynecology

## 2021-06-24 VITALS — BP 134/79 | HR 70 | Wt 180.0 lb

## 2021-06-24 DIAGNOSIS — O0990 Supervision of high risk pregnancy, unspecified, unspecified trimester: Secondary | ICD-10-CM

## 2021-06-24 DIAGNOSIS — Z3A34 34 weeks gestation of pregnancy: Secondary | ICD-10-CM

## 2021-06-24 DIAGNOSIS — O10913 Unspecified pre-existing hypertension complicating pregnancy, third trimester: Secondary | ICD-10-CM

## 2021-06-24 DIAGNOSIS — O0993 Supervision of high risk pregnancy, unspecified, third trimester: Secondary | ICD-10-CM

## 2021-06-24 DIAGNOSIS — O10919 Unspecified pre-existing hypertension complicating pregnancy, unspecified trimester: Secondary | ICD-10-CM

## 2021-06-24 DIAGNOSIS — O24410 Gestational diabetes mellitus in pregnancy, diet controlled: Secondary | ICD-10-CM

## 2021-06-24 DIAGNOSIS — O09523 Supervision of elderly multigravida, third trimester: Secondary | ICD-10-CM

## 2021-06-24 NOTE — Patient Instructions (Signed)
Return to office for any scheduled appointments. Call the office or go to the MAU at [facility] [place] if: You begin to have strong, frequent contractions Your water breaks.  Sometimes it is a big gush of fluid, sometimes it is just a trickle that keeps getting your underwear wet or running down your legs You have vaginal bleeding.  It is normal to have a small amount of spotting if your cervix was checked.  You do not feel your baby moving like normal.  If you do not, get something to eat and drink and lay down and focus on feeling your baby move.   If your baby is still not moving like normal, you should call the office or go to MAU. Any other obstetric concerns.  

## 2021-06-24 NOTE — Progress Notes (Signed)
? ?PRENATAL VISIT NOTE ? ?Subjective:  ?Anne Phelps is a 36 y.o. G2P1001 at [redacted]w[redacted]d being seen today for ongoing prenatal care.  She is currently monitored for the following issues for this high-risk pregnancy and has Rh negative state in antepartum period; BMI 25.0-25.9,adult; Supervision of high risk pregnancy, antepartum; Chronic hypertension affecting pregnancy; Multigravida of advanced maternal age in third trimester; and Gestational diabetes mellitus (GDM), antepartum on their problem list. ? ?Patient reports no complaints.  Contractions: Irritability. Vag. Bleeding: None.  Movement: Present. Denies leaking of fluid.  ? ?The following portions of the patient's history were reviewed and updated as appropriate: allergies, current medications, past family history, past medical history, past social history, past surgical history and problem list.  ? ?Objective:  ? ?Vitals:  ? 06/24/21 0855  ?BP: 134/79  ?Pulse: 70  ?Weight: 180 lb (81.6 kg)  ? ? ?Fetal Status: Fetal Heart Rate (bpm): 143   Movement: Present    ? ?General:  Alert, oriented and cooperative. Patient is in no acute distress.  ?Skin: Skin is warm and dry. No rash noted.   ?Cardiovascular: Normal heart rate noted  ?Respiratory: Normal respiratory effort, no problems with respiration noted  ?Abdomen: Soft, gravid, appropriate for gestational age.  Pain/Pressure: Absent     ?Pelvic: Cervical exam deferred        ?Extremities: Normal range of motion.  Edema: None  ?Mental Status: Normal mood and affect. Normal behavior. Normal judgment and thought content.  ? ?Imaging: ?[place] MFM OB FOLLOW UP ? ?Result Date: 05/28/2021 ?----------------------------------------------------------------------  OBSTETRICS REPORT                       (Signed Final 05/28/2021 04:15 pm) ---------------------------------------------------------------------- Patient Info  ID #:       [id number]                          D.O.B.:  April 08, 1985 (35 yrs)  Name:       TASHAYLA GRODEN Uhlir                   Visit Date: 05/28/2021 11:58 am ---------------------------------------------------------------------- Performed By  Attending:        Sander Nephew      Ref. Address:     [place]  Performed By:     Jacob Moores BS,       [facility]                                [facility]  Referred By:      [facility] ---------------------------------------------------------------------- Orders  #  Description                           Code        Ordered By  1  [place] MFM OB FOLLOW UP                   [phone]    Aletha Halim ----------------------------------------------------------------------  #  Order #                     Accession #                Episode #  1  [id number]                   [id number]                 [id number] ---------------------------------------------------------------------- Indications  [redacted] weeks gestation of pregnancy                Z3A.30  Hypertension - Chronic/Pre-existing (No        O10.019  meds)  Advanced maternal age multigravida 14+,        O16.523  third trimester (19 yrs)  LOW risk NIPS  /  Negative AFP  Gestational diabetes in pregnancy, diet        O24.410  controlled ---------------------------------------------------------------------- Fetal Evaluation  Num Of Fetuses:         1  Fetal Heart Rate(bpm):  145  Cardiac Activity:       Observed  Presentation:           Breech  Placenta:               Anterior  P. Cord Insertion:      Previously Visualized  Amniotic Fluid  AFI FV:      Within normal limits  AFI Sum(cm)     %Tile       Largest Pocket(cm)  21.2            83          7  RUQ(cm)       RLQ(cm)       LUQ(cm)        LLQ(cm)  4.5           4.4           7              5.3  ---------------------------------------------------------------------- Biometry  BPD:      76.7  mm     G. Age:  30w 5d         54  %    CI:        72.33   %    70 - 86  FL/HC:      20.1   %    19.2 - 21.4  HC:      286.9  mm     G. Age:  31w 4d         50  %    HC/AC:      1.10        0.99 - 1.21  AC:      260.5  mm     G. Age:  30w 1d         42  %    FL/BPD:     75.1   %    71 - 87  FL:       57.6  mm     G. Age:  30w 1d         32  %    FL/AC:      22.1   %    20 - 24  CER:      36.5  mm     G. Age:  30w 2d         32  %  LV:        5.4  mm  CM:        6.8  mm  Est. FW:    1558  gm      3 lb 7 oz     39  % ---------------------------------------------------------------------- OB History  Gravidity:    2         Term:   1        Prem:   0        SAB:   0  TOP:          0       Ectopic:  0        Living: 1 ---------------------------------------------------------------------- Gestational Age  LMP:           30w 2d        Date:  10/28/20                 EDD:   08/04/21  U/S Today:     30w 5d                                        EDD:   08/01/21  Best:          30w 2d     Det. By:  LMP  (10/28/20)          EDD:   08/04/21 ---------------------------------------------------------------------- Anatomy  Cranium:               Appears normal         LVOT:                   Previously seen  Cavum:                 Appears normal         Aortic Arch:            Previously seen  Ventricles:            Appears normal         Ductal Arch:            Previously seen  Choroid Plexus:        Previously seen  Diaphragm:              Appears normal  Cerebellum:            Appears normal         Stomach:                Appears normal, left                                                                        sided  Posterior Fossa:       Previously seen        Abdomen:                Previously seen  Nuchal Fold:           Not applicable (Q000111Q    Abdominal Wall:          Previously seen                         wks GA)  Face:                  Orbits and profile     Cord Vessels:           Previously seen                         previously seen  Lips:                  Previously seen        Kidneys:                Appear normal  Palate:                Previously seen        Bladder:                Appears normal  Thoracic:              Appears normal         Spine:                  Previously seen  Heart:                 Appears normal         Upper Extremities:      Previously seen                         (4CH, axis, and                         situs)  RVOT:                  Previously seen        Lower Extremities:      Previously seen  Other:  VC, 3VV and 3VTV visualized. Fetus appears to be female. Heels          and 5th digit, nasal bone previously visualized. Technically difficult          due to fetal position. ---------------------------------------------------------------------- Cervix Uterus Adnexa  Cervix  Normal appearance by transabdominal scan.  Uterus  Normal shape and size.  Right Ovary  Within normal limits.  Left Ovary  Within normal limits.  Cul De Sac  No free fluid seen.  Adnexa  No adnexal mass visualized. ---------------------------------------------------------------------- Impression  Follow up growth due to A1GDM and chronic hypertension no  meds.  Normal interval growth with measurements consistent with  dates  Good fetal movement and amniotic fluid volume ---------------------------------------------------------------------- Recommendations  Repeat growth in 4 weeks. ----------------------------------------------------------------------               Sander Nephew, MD Electronically Signed Final Report   05/28/2021 04:15 pm ----------------------------------------------------------------------  ? ?Assessment and Plan:  ?Pregnancy: G2P1001 at [redacted]w[redacted]d ?1. Chronic hypertension affecting pregnancy ?Stable BP. On ASA. Already getting serial scans as per  MFM. ? ?2. Diet controlled gestational diabetes mellitus (GDM), antepartum ?Stable CBGs.   ? ?3. Multigravida of advanced maternal age in third trimester ?4. [redacted] weeks gestation of pregnancy ?5. Supervision of hi

## 2021-06-25 ENCOUNTER — Ambulatory Visit: Payer: BC Managed Care – PPO | Admitting: *Deleted

## 2021-06-25 ENCOUNTER — Ambulatory Visit: Payer: BC Managed Care – PPO | Attending: Maternal & Fetal Medicine

## 2021-06-25 ENCOUNTER — Other Ambulatory Visit: Payer: Self-pay | Admitting: *Deleted

## 2021-06-25 ENCOUNTER — Encounter: Payer: Self-pay | Admitting: *Deleted

## 2021-06-25 VITALS — BP 134/65 | HR 67

## 2021-06-25 DIAGNOSIS — O10013 Pre-existing essential hypertension complicating pregnancy, third trimester: Secondary | ICD-10-CM

## 2021-06-25 DIAGNOSIS — O10913 Unspecified pre-existing hypertension complicating pregnancy, third trimester: Secondary | ICD-10-CM | POA: Insufficient documentation

## 2021-06-25 DIAGNOSIS — O09523 Supervision of elderly multigravida, third trimester: Secondary | ICD-10-CM

## 2021-06-25 DIAGNOSIS — Z3A34 34 weeks gestation of pregnancy: Secondary | ICD-10-CM | POA: Diagnosis not present

## 2021-06-25 DIAGNOSIS — O24410 Gestational diabetes mellitus in pregnancy, diet controlled: Secondary | ICD-10-CM | POA: Diagnosis not present

## 2021-06-25 DIAGNOSIS — O0990 Supervision of high risk pregnancy, unspecified, unspecified trimester: Secondary | ICD-10-CM | POA: Insufficient documentation

## 2021-06-25 DIAGNOSIS — Z362 Encounter for other antenatal screening follow-up: Secondary | ICD-10-CM

## 2021-07-06 ENCOUNTER — Encounter: Payer: BC Managed Care – PPO | Admitting: Obstetrics and Gynecology

## 2021-07-08 ENCOUNTER — Encounter: Payer: Self-pay | Admitting: Obstetrics & Gynecology

## 2021-07-08 ENCOUNTER — Other Ambulatory Visit (HOSPITAL_COMMUNITY)
Admission: RE | Admit: 2021-07-08 | Discharge: 2021-07-08 | Disposition: A | Discharge status: Home / Self Care | Payer: BC Managed Care – PPO | Source: Ambulatory Visit | Attending: Obstetrics and Gynecology | Admitting: Obstetrics and Gynecology

## 2021-07-08 ENCOUNTER — Ambulatory Visit (INDEPENDENT_AMBULATORY_CARE_PROVIDER_SITE_OTHER): Payer: BC Managed Care – PPO | Admitting: Obstetrics & Gynecology

## 2021-07-08 VITALS — BP 138/83 | HR 88 | Wt 180.0 lb

## 2021-07-08 DIAGNOSIS — O24410 Gestational diabetes mellitus in pregnancy, diet controlled: Secondary | ICD-10-CM

## 2021-07-08 DIAGNOSIS — O10919 Unspecified pre-existing hypertension complicating pregnancy, unspecified trimester: Secondary | ICD-10-CM

## 2021-07-08 DIAGNOSIS — Z3A36 36 weeks gestation of pregnancy: Secondary | ICD-10-CM | POA: Insufficient documentation

## 2021-07-08 DIAGNOSIS — O09523 Supervision of elderly multigravida, third trimester: Secondary | ICD-10-CM

## 2021-07-08 DIAGNOSIS — O0990 Supervision of high risk pregnancy, unspecified, unspecified trimester: Secondary | ICD-10-CM | POA: Insufficient documentation

## 2021-07-08 DIAGNOSIS — O0993 Supervision of high risk pregnancy, unspecified, third trimester: Secondary | ICD-10-CM | POA: Diagnosis not present

## 2021-07-08 NOTE — Patient Instructions (Addendum)
Induction of labor scheduled 07/28/2021 in the morning  Return to office for any scheduled appointments. Call the office or go to the MAU at [facility] if: You begin to have strong, frequent contractions Your water breaks.  Sometimes it is a big gush of fluid, sometimes it is just a trickle that keeps getting your underwear wet or running down your legs You have vaginal bleeding.  It is normal to have a small amount of spotting if your cervix was checked.  You do not feel your baby moving like normal.  If you do not, get something to eat and drink and lay down and focus on feeling your baby move.   If your baby is still not moving like normal, you should call the office or go to MAU. Any other obstetric concerns.

## 2021-07-08 NOTE — Progress Notes (Signed)
   PRENATAL VISIT NOTE  Subjective:  Anne Phelps is a 36 y.o. G2P1001 at [redacted]w[redacted]d being seen today for ongoing prenatal care.  She is currently monitored for the following issues for this high-risk pregnancy and has Rh negative state in antepartum period; BMI 25.0-25.9,adult; Supervision of high risk pregnancy, antepartum; Chronic hypertension affecting pregnancy; Multigravida of advanced maternal age in third trimester; and Gestational diabetes mellitus (GDM), antepartum on their problem list.  Patient reports no complaints.  Contractions: Not present. Vag. Bleeding: None.  Movement: Present. Denies leaking of fluid.   The following portions of the patient's history were reviewed and updated as appropriate: allergies, current medications, past family history, past medical history, past social history, past surgical history and problem list.   Objective:   Vitals:   07/08/21 0848  BP: 138/83  Pulse: 88  Weight: 180 lb (81.6 kg)    Fetal Status: Fetal Heart Rate (bpm): 143   Movement: Present  Presentation: Vertex (confirmed on ultrasound)  General:  Alert, oriented and cooperative. Patient is in no acute distress.  Skin: Skin is warm and dry. No rash noted.   Cardiovascular: Normal heart rate noted  Respiratory: Normal respiratory effort, no problems with respiration noted  Abdomen: Soft, gravid, appropriate for gestational age.  Pain/Pressure: Present     Pelvic: Cervical exam performed in the presence of a chaperone Dilation: Closed Effacement (%): Thick Station: Ballotable  Extremities: Normal range of motion.  Edema: None  Mental Status: Normal mood and affect. Normal behavior. Normal judgment and thought content.   Assessment and Plan:  Pregnancy: G2P1001 at [redacted]w[redacted]d 1. Chronic hypertension affecting pregnancy Stable BP. On ASA. Already getting serial scans as per MFM.  2. Diet controlled gestational diabetes mellitus (GDM), antepartum Stable CBGs.    3. [redacted] weeks gestation of  pregnancy 4. Multigravida of advanced maternal age in third trimester 5. Supervision of high risk pregnancy, antepartum Pelvic cultures done today, will follow up results and manage accordingly. - Cervicovaginal ancillary only - Strep Gp B Culture+Rflx  Discussed IOL at 39-40 weeks.  She desires IOL at 39 weeks.  Risks and benefits of induction were reviewed.  Options of misoprostol foley bulb, artificial rupture of membranes, and pitocin reviewed, with use of each discussed in detail. All questions answered.  Induction of labor scheduled on 07/28/2021, orders have been signed and held. She was told to expect a call from [facility] with further instructions about the induction of labor. She was told that the only scheduled time slot was midnight, patients are called in during the morning between 6:30am - 10 am to come in for their induction as soon as the rooms and staff are ready for them.   Preterm labor symptoms and general obstetric precautions including but not limited to vaginal bleeding, contractions, leaking of fluid and fetal movement were reviewed in detail with the patient. Please refer to After Visit Summary for other counseling recommendations.   Return in about 1 week (around 07/15/2021) for OFFICE OB VISIT (MD only).  Future Appointments  Date Time Provider [place]  07/15/2021  8:55 AM Darlina Rumpf, CNM [facility]  07/20/2021 10:15 AM [facility] NURSE [facility]  07/20/2021 10:30 AM [facility] US2 [facility]  07/22/2021  8:55 AM Aletha Halim, MD [facility]  07/28/2021  6:30 AM [facility] SCHED ROOM [facility] None    Verita Schneiders, MD

## 2021-07-09 LAB — CERVICOVAGINAL ANCILLARY ONLY
Chlamydia: NEGATIVE
Comment: NEGATIVE
Comment: NORMAL
Neisseria Gonorrhea: NEGATIVE

## 2021-07-12 LAB — STREP GP B CULTURE+RFLX: Strep Gp B Culture+Rflx: NEGATIVE

## 2021-07-14 ENCOUNTER — Telehealth (HOSPITAL_COMMUNITY): Payer: Self-pay | Admitting: *Deleted

## 2021-07-14 ENCOUNTER — Other Ambulatory Visit: Payer: Self-pay | Admitting: Obstetrics and Gynecology

## 2021-07-14 ENCOUNTER — Encounter (HOSPITAL_COMMUNITY): Payer: Self-pay | Admitting: *Deleted

## 2021-07-14 NOTE — Telephone Encounter (Signed)
Preadmission screen  

## 2021-07-15 ENCOUNTER — Ambulatory Visit (INDEPENDENT_AMBULATORY_CARE_PROVIDER_SITE_OTHER): Payer: BC Managed Care – PPO | Admitting: Advanced Practice Midwife

## 2021-07-15 VITALS — BP 135/81 | HR 76 | Wt 180.0 lb

## 2021-07-15 DIAGNOSIS — O10919 Unspecified pre-existing hypertension complicating pregnancy, unspecified trimester: Secondary | ICD-10-CM

## 2021-07-15 DIAGNOSIS — O09523 Supervision of elderly multigravida, third trimester: Secondary | ICD-10-CM

## 2021-07-15 DIAGNOSIS — O24410 Gestational diabetes mellitus in pregnancy, diet controlled: Secondary | ICD-10-CM

## 2021-07-15 DIAGNOSIS — O0990 Supervision of high risk pregnancy, unspecified, unspecified trimester: Secondary | ICD-10-CM

## 2021-07-15 MED ORDER — ASPIRIN 81 MG PO TBEC: 81.0000 mg | DELAYED_RELEASE_TABLET | Freq: Every day | ORAL | 12 refills | Status: DC

## 2021-07-15 NOTE — Progress Notes (Signed)
ROB [redacted]w[redacted]d  CC: Baby's presentation. Wants to be sure baby is head down.  Needs refill on aspirin

## 2021-07-15 NOTE — Progress Notes (Signed)
   PRENATAL VISIT NOTE  Subjective:  Anne Phelps is a 36 y.o. G2P1001 at [redacted]w[redacted]d being seen today for ongoing prenatal care.  She is currently monitored for the following issues for this high-risk pregnancy and has Rh negative state in antepartum period; BMI 25.0-25.9,adult; Supervision of high risk pregnancy, antepartum; Chronic hypertension affecting pregnancy; Multigravida of advanced maternal age in third trimester; and Gestational diabetes mellitus (GDM), antepartum on their problem list.  Patient reports no complaints.  Contractions: Not present. Vag. Bleeding: None.  Movement: Present. Denies leaking of fluid.   Patient states she has three bASA pills left and requests refill.  The following portions of the patient's history were reviewed and updated as appropriate: allergies, current medications, past family history, past medical history, past social history, past surgical history and problem list. Problem list updated.  Objective:   Vitals:   07/15/21 0901  BP: 135/81  Pulse: 76  Weight: 180 lb (81.6 kg)    Fetal Status: Fetal Heart Rate (bpm): 158   Movement: Present     General:  Alert, oriented and cooperative. Patient is in no acute distress.  Skin: Skin is warm and dry. No rash noted.   Cardiovascular: Normal heart rate noted  Respiratory: Normal respiratory effort, no problems with respiration noted  Abdomen: Soft, gravid, appropriate for gestational age.  Pain/Pressure: Present     Pelvic: Cervical exam deferred        Extremities: Normal range of motion.  Edema: None  Mental Status: Normal mood and affect. Normal behavior. Normal judgment and thought content.   Assessment and Plan:  Pregnancy: G2P1001 at [redacted]w[redacted]d  1. Supervision of high risk pregnancy, antepartum - No acute concerns - Cephalic confirmed with BSUS today  2. Multigravida of advanced maternal age in third trimester   3. Chronic hypertension affecting pregnancy - bASA refilled  4. Diet  controlled gestational diabetes mellitus (GDM), antepartum - CBGs scanned into media tab, doing very well!  Term labor symptoms and general obstetric precautions including but not limited to vaginal bleeding, contractions, leaking of fluid and fetal movement were reviewed in detail with the patient. Please refer to After Visit Summary for other counseling recommendations.  Return in about 1 week (around 07/22/2021) for Has next appointment scheduled :).  Future Appointments  Date Time Provider Department Center  07/20/2021 10:15 AM [facility] NURSE [facility]  07/20/2021 10:30 AM [facility] US2 [facility]  07/22/2021  8:55 AM [place] Bing, MD [facility]  07/28/2021  6:30 AM [facility]-LD SCHED ROOM [facility] None    Calvert Cantor, CNM

## 2021-07-20 ENCOUNTER — Encounter: Payer: Self-pay | Admitting: *Deleted

## 2021-07-20 ENCOUNTER — Ambulatory Visit: Payer: BC Managed Care – PPO | Attending: Obstetrics and Gynecology

## 2021-07-20 ENCOUNTER — Ambulatory Visit: Payer: BC Managed Care – PPO | Admitting: *Deleted

## 2021-07-20 VITALS — BP 132/71 | HR 66

## 2021-07-20 DIAGNOSIS — O10913 Unspecified pre-existing hypertension complicating pregnancy, third trimester: Secondary | ICD-10-CM | POA: Insufficient documentation

## 2021-07-20 DIAGNOSIS — Z3A37 37 weeks gestation of pregnancy: Secondary | ICD-10-CM | POA: Diagnosis not present

## 2021-07-20 DIAGNOSIS — O0990 Supervision of high risk pregnancy, unspecified, unspecified trimester: Secondary | ICD-10-CM

## 2021-07-20 DIAGNOSIS — O09523 Supervision of elderly multigravida, third trimester: Secondary | ICD-10-CM | POA: Insufficient documentation

## 2021-07-20 DIAGNOSIS — O24410 Gestational diabetes mellitus in pregnancy, diet controlled: Secondary | ICD-10-CM | POA: Diagnosis not present

## 2021-07-20 DIAGNOSIS — O10013 Pre-existing essential hypertension complicating pregnancy, third trimester: Secondary | ICD-10-CM | POA: Diagnosis not present

## 2021-07-21 ENCOUNTER — Other Ambulatory Visit: Payer: Self-pay | Admitting: Advanced Practice Midwife

## 2021-07-22 ENCOUNTER — Ambulatory Visit (INDEPENDENT_AMBULATORY_CARE_PROVIDER_SITE_OTHER): Payer: BC Managed Care – PPO | Admitting: Obstetrics and Gynecology

## 2021-07-22 VITALS — BP 139/90 | HR 58 | Wt 181.0 lb

## 2021-07-22 DIAGNOSIS — O0990 Supervision of high risk pregnancy, unspecified, unspecified trimester: Secondary | ICD-10-CM

## 2021-07-22 DIAGNOSIS — Z6791 Unspecified blood type, Rh negative: Secondary | ICD-10-CM

## 2021-07-22 DIAGNOSIS — O24410 Gestational diabetes mellitus in pregnancy, diet controlled: Secondary | ICD-10-CM

## 2021-07-22 DIAGNOSIS — Z3A38 38 weeks gestation of pregnancy: Secondary | ICD-10-CM

## 2021-07-22 DIAGNOSIS — O09523 Supervision of elderly multigravida, third trimester: Secondary | ICD-10-CM

## 2021-07-22 DIAGNOSIS — O26899 Other specified pregnancy related conditions, unspecified trimester: Secondary | ICD-10-CM

## 2021-07-22 DIAGNOSIS — O10919 Unspecified pre-existing hypertension complicating pregnancy, unspecified trimester: Secondary | ICD-10-CM

## 2021-07-22 NOTE — Progress Notes (Signed)
ROB 38wks1d  Pt wants cervix check   CC: None   B/P elevated pt denies any HA's, no visual changes, no swelling.

## 2021-07-22 NOTE — Progress Notes (Signed)
   PRENATAL VISIT NOTE  Subjective:  Anne Phelps is a 36 y.o. G2P1001 at [redacted]w[redacted]d being seen today for ongoing prenatal care.  She is currently monitored for the following issues for this high-risk pregnancy and has Rh negative state in antepartum period; BMI 25.0-25.9,adult; Supervision of high risk pregnancy, antepartum; Chronic hypertension affecting pregnancy; Multigravida of advanced maternal age in third trimester; and Gestational diabetes mellitus (GDM), antepartum on their problem list.  Patient reports no complaints.  Contractions: Not present. Vag. Bleeding: None.  Movement: Present. Denies leaking of fluid.   The following portions of the patient's history were reviewed and updated as appropriate: allergies, current medications, past family history, past medical history, past social history, past surgical history and problem list.   Objective:   Vitals:   07/22/21 0905  BP: (!) 142/86  Pulse: (!) 58  Weight: 181 lb (82.1 kg)    Fetal Status: Fetal Heart Rate (bpm): 140   Movement: Present  Presentation: Vertex  General:  Alert, oriented and cooperative. Patient is in no acute distress.  Skin: Skin is warm and dry. No rash noted.   Cardiovascular: Normal heart rate noted  Respiratory: Normal respiratory effort, no problems with respiration noted  Abdomen: Soft, gravid, appropriate for gestational age.  Pain/Pressure: Present     Pelvic: Cervical exam performed in the presence of a chaperone Dilation: Fingertip Effacement (%): 50 Station: Ballotable  Extremities: Normal range of motion.  Edema: None  Mental Status: Normal mood and affect. Normal behavior. Normal judgment and thought content.   Assessment and Plan:  Pregnancy: G2P1001 at [redacted]w[redacted]d 1. Chronic hypertension affecting pregnancy On no meds. At home babyscripts BPs not close to elevated (100s-110s/60s-70s). Repeat BP today 139/90. Pt told to check them qday at home.   2. Diet controlled gestational diabetes mellitus  (GDM), antepartum Normal CBG log  3. Supervision of high risk pregnancy, antepartum 6/6: ceph, 8/8, afi 15, 42% 3129g No further antenatal testing indicated  4. Rh negative state in antepartum period Rhogam PP PRN  5. Multigravida of advanced maternal age in third trimester  6. [redacted] weeks gestation of pregnancy GBS negative. OCPs  Term labor symptoms and general obstetric precautions including but not limited to vaginal bleeding, contractions, leaking of fluid and fetal movement were reviewed in detail with the patient. Please refer to After Visit Summary for other counseling recommendations.   No follow-ups on file.  Future Appointments  Date Time Provider Department Center  07/28/2021  6:30 AM [facility] SCHED ROOM [facility] None  09/08/2021  8:35 AM Reva Bores, MD [facility]    [place] Bing, MD

## 2021-07-28 ENCOUNTER — Inpatient Hospital Stay (HOSPITAL_COMMUNITY): Payer: BC Managed Care – PPO | Admitting: Anesthesiology

## 2021-07-28 ENCOUNTER — Encounter (HOSPITAL_COMMUNITY): Payer: Self-pay | Admitting: Obstetrics & Gynecology

## 2021-07-28 ENCOUNTER — Other Ambulatory Visit: Payer: Self-pay

## 2021-07-28 ENCOUNTER — Inpatient Hospital Stay (HOSPITAL_COMMUNITY): Payer: BC Managed Care – PPO

## 2021-07-28 ENCOUNTER — Inpatient Hospital Stay (HOSPITAL_COMMUNITY)
Admission: RE | Admit: 2021-07-28 | Discharge: 2021-07-29 | DRG: 807 | Disposition: A | Discharge status: Home / Self Care | Payer: BC Managed Care – PPO | Attending: Obstetrics and Gynecology | Admitting: Obstetrics and Gynecology

## 2021-07-28 DIAGNOSIS — I10 Essential (primary) hypertension: Secondary | ICD-10-CM | POA: Diagnosis present

## 2021-07-28 DIAGNOSIS — O1002 Pre-existing essential hypertension complicating childbirth: Principal | ICD-10-CM | POA: Diagnosis present

## 2021-07-28 DIAGNOSIS — Z8632 Personal history of gestational diabetes: Secondary | ICD-10-CM | POA: Diagnosis present

## 2021-07-28 DIAGNOSIS — O24429 Gestational diabetes mellitus in childbirth, unspecified control: Secondary | ICD-10-CM | POA: Diagnosis not present

## 2021-07-28 DIAGNOSIS — Z7982 Long term (current) use of aspirin: Secondary | ICD-10-CM | POA: Diagnosis not present

## 2021-07-28 DIAGNOSIS — O43893 Other placental disorders, third trimester: Secondary | ICD-10-CM | POA: Diagnosis not present

## 2021-07-28 DIAGNOSIS — O0990 Supervision of high risk pregnancy, unspecified, unspecified trimester: Secondary | ICD-10-CM

## 2021-07-28 DIAGNOSIS — Z6791 Unspecified blood type, Rh negative: Secondary | ICD-10-CM

## 2021-07-28 DIAGNOSIS — O24420 Gestational diabetes mellitus in childbirth, diet controlled: Secondary | ICD-10-CM | POA: Diagnosis present

## 2021-07-28 DIAGNOSIS — O10919 Unspecified pre-existing hypertension complicating pregnancy, unspecified trimester: Principal | ICD-10-CM

## 2021-07-28 DIAGNOSIS — O134 Gestational [pregnancy-induced] hypertension without significant proteinuria, complicating childbirth: Secondary | ICD-10-CM | POA: Diagnosis not present

## 2021-07-28 DIAGNOSIS — Z3A39 39 weeks gestation of pregnancy: Secondary | ICD-10-CM | POA: Diagnosis not present

## 2021-07-28 DIAGNOSIS — O24410 Gestational diabetes mellitus in pregnancy, diet controlled: Secondary | ICD-10-CM

## 2021-07-28 DIAGNOSIS — O24419 Gestational diabetes mellitus in pregnancy, unspecified control: Secondary | ICD-10-CM | POA: Diagnosis present

## 2021-07-28 DIAGNOSIS — O26893 Other specified pregnancy related conditions, third trimester: Secondary | ICD-10-CM | POA: Diagnosis not present

## 2021-07-28 DIAGNOSIS — O26899 Other specified pregnancy related conditions, unspecified trimester: Secondary | ICD-10-CM

## 2021-07-28 DIAGNOSIS — O365930 Maternal care for other known or suspected poor fetal growth, third trimester, not applicable or unspecified: Secondary | ICD-10-CM | POA: Diagnosis not present

## 2021-07-28 LAB — PROTEIN / CREATININE RATIO, URINE
Creatinine, Urine: 63.96 mg/dL
Protein Creatinine Ratio: 0.11 mg/mg{Cre} (ref 0.00–0.15)
Total Protein, Urine: 7 mg/dL

## 2021-07-28 LAB — COMPREHENSIVE METABOLIC PANEL
ALT: 15 U/L (ref 0–44)
AST: 20 U/L (ref 15–41)
Albumin: 2.9 g/dL — ABNORMAL LOW (ref 3.5–5.0)
Alkaline Phosphatase: 110 U/L (ref 38–126)
Anion gap: 9 (ref 5–15)
BUN: 8 mg/dL (ref 6–20)
CO2: 23 mmol/L (ref 22–32)
Calcium: 9.1 mg/dL (ref 8.9–10.3)
Chloride: 107 mmol/L (ref 98–111)
Creatinine, Ser: 0.53 mg/dL (ref 0.44–1.00)
GFR, Estimated: >60 mL/min (ref >60)
Glucose, Bld: 89 mg/dL (ref 70–99)
Potassium: 3.6 mmol/L (ref 3.5–5.1)
Sodium: 139 mmol/L (ref 135–145)
Total Bilirubin: 0.3 mg/dL (ref 0.3–1.2)
Total Protein: 6.2 g/dL — ABNORMAL LOW (ref 6.5–8.1)

## 2021-07-28 LAB — CBC
HCT: 35.1 % — ABNORMAL LOW (ref 36.0–46.0)
HCT: 33.3 % — ABNORMAL LOW (ref 36.0–46.0)
Hemoglobin: 12 g/dL (ref 12.0–15.0)
Hemoglobin: 11.1 g/dL — ABNORMAL LOW (ref 12.0–15.0)
MCH: 29.3 pg (ref 26.0–34.0)
MCH: 28.6 pg (ref 26.0–34.0)
MCHC: 34.2 g/dL (ref 30.0–36.0)
MCHC: 33.3 g/dL (ref 30.0–36.0)
MCV: 85.8 fL (ref 80.0–100.0)
MCV: 85.8 fL (ref 80.0–100.0)
Platelets: 224 10*3/uL (ref 150–400)
Platelets: 213 10*3/uL (ref 150–400)
RBC: 4.09 MIL/uL (ref 3.87–5.11)
RBC: 3.88 MIL/uL (ref 3.87–5.11)
RDW: 13.3 % (ref 11.5–15.5)
RDW: 13.4 % (ref 11.5–15.5)
WBC: 11.7 10*3/uL — ABNORMAL HIGH (ref 4.0–10.5)
WBC: 18.8 10*3/uL — ABNORMAL HIGH (ref 4.0–10.5)
nRBC: 0 % (ref 0.0–0.2)
nRBC: 0 % (ref 0.0–0.2)

## 2021-07-28 LAB — TYPE AND SCREEN
ABO/RH(D): B NEG
Antibody Screen: POSITIVE

## 2021-07-28 LAB — RPR: RPR Ser Ql: NONREACTIVE

## 2021-07-28 LAB — GLUCOSE, CAPILLARY
Glucose-Capillary: 79 mg/dL (ref 70–99)
Glucose-Capillary: 86 mg/dL (ref 70–99)

## 2021-07-28 MED ORDER — OXYTOCIN-SODIUM CHLORIDE 30-0.9 UT/500ML-% IV SOLN
2.5000 [IU]/h | INTRAVENOUS | Status: DC
  Administered 2021-07-28: 2.5 [IU]/h via INTRAVENOUS
  Filled 2021-07-28: qty 500

## 2021-07-28 MED ORDER — ACETAMINOPHEN 325 MG PO TABS: 650.0000 mg | ORAL_TABLET | ORAL | Status: DC | PRN

## 2021-07-28 MED ORDER — LACTATED RINGERS IV SOLN: 500.0000 mL | INTRAVENOUS | Status: DC | PRN

## 2021-07-28 MED ORDER — OXYTOCIN BOLUS FROM INFUSION
333.0000 mL | Freq: Once | INTRAVENOUS | Status: AC
  Administered 2021-07-28: 333 mL via INTRAVENOUS

## 2021-07-28 MED ORDER — ONDANSETRON HCL 4 MG/2ML IJ SOLN: 4.0000 mg | Freq: Four times a day (QID) | INTRAMUSCULAR | Status: DC | PRN

## 2021-07-28 MED ORDER — LACTATED RINGERS IV SOLN
INTRAVENOUS | Status: DC
  Administered 2021-07-28 (×2): 1000 mL via INTRAVENOUS

## 2021-07-28 MED ORDER — MISOPROSTOL 25 MCG QUARTER TABLET: 25.0000 ug | ORAL_TABLET | ORAL | Status: DC | PRN

## 2021-07-28 MED ORDER — MISOPROSTOL 50MCG HALF TABLET
50.0000 ug | ORAL_TABLET | ORAL | Status: DC | PRN
Start: 2021-07-28 — End: 2021-07-28
  Administered 2021-07-28: 50 ug via ORAL
  Filled 2021-07-28: qty 1

## 2021-07-28 MED ORDER — FENTANYL-BUPIVACAINE-NACL 0.5-0.125-0.9 MG/250ML-% EP SOLN
12.0000 mL/h | EPIDURAL | Status: DC | PRN
  Administered 2021-07-28: 12 mL/h via EPIDURAL
  Filled 2021-07-28: qty 250

## 2021-07-28 MED ORDER — EPHEDRINE 5 MG/ML INJ
10.0000 mg | INTRAVENOUS | Status: DC | PRN
Start: 2021-07-28 — End: 2021-07-28

## 2021-07-28 MED ORDER — TERBUTALINE SULFATE 1 MG/ML IJ SOLN: 0.2500 mg | Freq: Once | INTRAMUSCULAR | Status: DC | PRN

## 2021-07-28 MED ORDER — FENTANYL CITRATE (PF) 100 MCG/2ML IJ SOLN: 50.0000 ug | INTRAMUSCULAR | Status: DC | PRN

## 2021-07-28 MED ORDER — FLEET ENEMA 7-19 GM/118ML RE ENEM: 1.0000 | ENEMA | Freq: Every day | RECTAL | Status: DC | PRN

## 2021-07-28 MED ORDER — EPHEDRINE 5 MG/ML INJ: 10.0000 mg | INTRAVENOUS | Status: DC | PRN

## 2021-07-28 MED ORDER — SOD CITRATE-CITRIC ACID 500-334 MG/5ML PO SOLN: 30.0000 mL | ORAL | Status: DC | PRN

## 2021-07-28 MED ORDER — HYDROXYZINE HCL 50 MG PO TABS: 50.0000 mg | ORAL_TABLET | Freq: Four times a day (QID) | ORAL | Status: DC | PRN

## 2021-07-28 MED ORDER — ZOLPIDEM TARTRATE 5 MG PO TABS: 5.0000 mg | ORAL_TABLET | Freq: Every evening | ORAL | Status: DC | PRN

## 2021-07-28 MED ORDER — OXYCODONE-ACETAMINOPHEN 5-325 MG PO TABS: 2.0000 | ORAL_TABLET | ORAL | Status: DC | PRN

## 2021-07-28 MED ORDER — OXYTOCIN-SODIUM CHLORIDE 30-0.9 UT/500ML-% IV SOLN
1.0000 m[IU]/min | INTRAVENOUS | Status: DC
  Administered 2021-07-28: 2 m[IU]/min via INTRAVENOUS

## 2021-07-28 MED ORDER — OXYCODONE-ACETAMINOPHEN 5-325 MG PO TABS: 1.0000 | ORAL_TABLET | ORAL | Status: DC | PRN

## 2021-07-28 MED ORDER — LACTATED RINGERS IV SOLN
500.0000 mL | Freq: Once | INTRAVENOUS | Status: AC
  Administered 2021-07-28: 500 mL via INTRAVENOUS

## 2021-07-28 MED ORDER — DIPHENHYDRAMINE HCL 50 MG/ML IJ SOLN: 12.5000 mg | INTRAMUSCULAR | Status: DC | PRN

## 2021-07-28 MED ORDER — LIDOCAINE HCL (PF) 1 % IJ SOLN
30.0000 mL | INTRAMUSCULAR | Status: DC | PRN
Start: 2021-07-28 — End: 2021-07-28

## 2021-07-28 MED ORDER — LIDOCAINE HCL (PF) 1 % IJ SOLN
INTRAMUSCULAR | Status: DC | PRN
  Administered 2021-07-28: 5 mL via EPIDURAL
  Administered 2021-07-28: 4 mL via EPIDURAL

## 2021-07-28 MED ORDER — PHENYLEPHRINE 80 MCG/ML (10ML) SYRINGE FOR IV PUSH (FOR BLOOD PRESSURE SUPPORT): 80.0000 ug | PREFILLED_SYRINGE | INTRAVENOUS | Status: DC | PRN

## 2021-07-28 NOTE — H&P (Addendum)
OBSTETRIC ADMISSION HISTORY AND PHYSICAL  Anne Phelps is a 36 y.o. female G2P1001 with IUP at 18w0dby LMP presenting for IOL for cHTN and A1GDM. She reports +FMs, No LOF, no VB, no blurry vision, headaches or peripheral edema, and RUQ pain. She plans on bottle feeding. She requests pills for birth control. 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   Pregnancy induced hypertension    [place]     Past Surgical History: Past Surgical History:  Procedure Laterality Date   NO PAST SURGERIES      Obstetrical History: OB History     Gravida  2   Para  1   Term  1   Preterm      AB      Living  1      SAB      IAB      Ectopic      Multiple      Live Births  1           Social History Social History   Socioeconomic History   Marital status: Married    Spouse name: JSulay Brymer  Number of children: 1   Years of education: Associates   Highest education level: Not on file  Occupational History   Occupation: child care  Tobacco Use   Smoking status: Never   Smokeless tobacco: Never  Vaping Use   Vaping Use: Never used  Substance and Sexual Activity   Alcohol use: No    Alcohol/week: 0.0 standard drinks of alcohol   Drug use: No   Sexual activity: Yes    Partners: Male  Other Topics Concern   Not on file  Social History Narrative   Lives with her husband and their daughter.   Husband travels frequently for work.   Parents and  in-laws live locally.   Social Determinants of Health   Financial Resource Strain: Not on file  Food Insecurity: No Food Insecurity (05/19/2021)   Hunger Vital Sign    Worried About Running Out of Food in the Last Year: Never true    Ran Out of Food in the Last Year: Never true  Transportation Needs: Not on file  Physical Activity: Not on file  Stress: Not on file  Social Connections: Not on file    Family History: Family History  Problem Relation Age of Onset   Diabetes Maternal Grandmother    Hypertension Maternal Grandmother    Stroke Maternal Grandmother    Heart attack Father    Hypertension Father    Hypertension Mother    Hypercholesterolemia Mother    Psoriasis Mother    Hypertension Maternal Grandfather    Hyperlipidemia Maternal Grandfather    Hyperlipidemia Paternal Grandfather    Hypertension Paternal Grandfather    Heart disease Paternal Grandfather    Stroke Paternal Grandfather    Breast cancer Cousin 30    Allergies: Allergies  Allergen Reactions   Penicillins Swelling and Rash  Throat swells    Medications Prior to Admission  Medication Sig Dispense Refill Last Dose   aspirin EC 81 MG tablet Take 1 tablet (81 mg total) by mouth daily. 60 tablet 2    aspirin EC 81 MG tablet Take 1 tablet (81 mg total) by mouth daily. Swallow whole. 30 tablet 12    Blood Glucose Monitoring Suppl (ONETOUCH VERIO) w/Device KIT 1 Device by Does not apply route as directed. 1 kit 0    ferrous gluconate (FERGON) 324 MG tablet Take 1 tablet (324 mg total) by mouth daily with breakfast. (Patient taking differently: Take 324 mg by mouth every other day.) 30 tablet 3    glucose blood (ONETOUCH VERIO) test strip Use as instructed 100 each 12    ONE TOUCH CLUB LANCETS MISC 1 Container by Does not apply route as directed. 100 each 1    prenatal vitamin w/FE, FA (PRENATAL 1 + 1) 27-1 MG TABS tablet Take 1 tablet by mouth daily at 12 noon.      Review of Systems   All systems  reviewed and negative except as stated in HPI  Blood pressure 138/88, pulse 73, temperature 98.6 F (37 C), temperature source Oral, resp. rate 16, height _0  (1.651 m), weight 81.7 kg, last menstrual period 10/28/2020. General appearance: alert, cooperative, and no distress Lungs: clear to auscultation bilaterally, normal rate & effort  Heart: regular rate and rhythm Abdomen: gravid Pelvic: Deferred due to patient being checked by RN earlier this morning Extremities: Homans sign is negative, no sign of DVT Presentation: cephalic Fetal monitoring: Baseline: 140 bpm, Variability: Good {> 6 bpm), Accelerations: Reactive, and Decelerations: Absent Uterine activity: None Dilation: 1 Effacement (%): 50 Station: -2 Exam by:: Nilsa Nutting, RN   Prenatal labs: ABO, Rh: --/--/PENDING (06/14 0034) Antibody: PENDING (06/14 0722) Rubella: 12.50 (12/08 0937) RPR: Non Reactive (03/30 0916)  HBsAg: Negative (12/08 9179)  HIV: Non Reactive (03/30 0916)  GBS: Negative/-- (05/25 0900)  2 hr Glucola: positive Genetic screening:  Normal Anatomy US: Normal  Prenatal Transfer Tool  Maternal Diabetes: Yes:  Diabetes Type:  Diet controlled Genetic Screening: Normal Maternal Ultrasounds/Referrals: Normal Fetal Ultrasounds or other Referrals:  Referred to Materal Fetal Medicine  Maternal Substance Abuse:  No Significant Maternal Medications:  None Significant Maternal Lab Results: Group B Strep negative and Rh negative  Results for orders placed or performed during the hospital encounter of 07/28/21 (from the past 24 hour(s))  Type and screen   Collection Time: 07/28/21  7:22 AM  Result Value Ref Range   ABO/RH(D) B NEG    Antibody Screen POS    Sample Expiration 07/31/2021,2359    Antibody Identification      PASSIVELY ACQUIRED ANTI-D Performed at [facility], [address], [place], [place] [address]   CBC   Collection Time: 07/28/21  7:42 AM  Result Value Ref Range   WBC 11.7  (H) 4.0 - 10.5 K/uL   RBC 4.09 3.87 - 5.11 MIL/uL   Hemoglobin 12.0 12.0 - 15.0 g/dL   HCT 35.1 (L) 36.0 - 46.0 %   MCV 85.8 80.0 - 100.0 fL   MCH 29.3 26.0 - 34.0 pg   MCHC 34.2 30.0 - 36.0 g/dL   RDW 13.3 11.5 - 15.5 %   Platelets 224 150 - 400 K/uL   nRBC 0.0 0.0 - 0.2 %  Comprehensive metabolic panel   Collection Time: 07/28/21  7:42 AM  Result Value Ref Range   Sodium  139 135 - 145 mmol/L   Potassium 3.6 3.5 - 5.1 mmol/L   Chloride 107 98 - 111 mmol/L   CO2 23 22 - 32 mmol/L   Glucose, Bld 89 70 - 99 mg/dL   BUN 8 6 - 20 mg/dL   Creatinine, Ser 0.53 0.44 - 1.00 mg/dL   Calcium 9.1 8.9 - 10.3 mg/dL   Total Protein 6.2 (L) 6.5 - 8.1 g/dL   Albumin 2.9 (L) 3.5 - 5.0 g/dL   AST 20 15 - 41 U/L   ALT 15 0 - 44 U/L   Alkaline Phosphatase 110 38 - 126 U/L   Total Bilirubin 0.3 0.3 - 1.2 mg/dL   GFR, Estimated >60 >60 mL/min   Anion gap 9 5 - 15  Protein / creatinine ratio, urine   Collection Time: 07/28/21  7:54 AM  Result Value Ref Range   Creatinine, Urine 63.96 mg/dL   Total Protein, Urine 7 mg/dL   Protein Creatinine Ratio 0.11 0.00 - 0.15 mg/mg[Cre]    Patient Active Problem List   Diagnosis Date Noted   Gestational diabetes mellitus (GDM), antepartum 05/19/2021   Chronic hypertension affecting pregnancy 01/21/2021   Multigravida of advanced maternal age in third trimester 01/21/2021   Supervision of high risk pregnancy, antepartum 12/29/2020   BMI 25.0-25.9,adult 04/11/2015   Rh negative state in antepartum period 03/19/2013    Assessment/Plan:  MODELLE VOLLMER is a 36 y.o. G2P1001 at 63w0dhere for IOL for cHTN and A1GDM.   #Labor: IOL. Cytotec x1 [phone]#Pain: Planning for Epidural #FWB: Cat 1 #ID: GBS negative #MOF: bottle feeding #MOC: OCP - desires same medication she was on previously  #cHTN: BP this morning 138/88, continue to monitor; PCR, LFTs, and platelets normal this morning  #A1GDM: FBG this morning 89, continue monitoring q4hrs  #Rh Negative:  Will give Rhogam after delivery   BLavone Neri SParadise 07/28/2021, 9:20 AM   Attestation of CNM Supervision of Student: Evaluation and management procedures were performed by the student under my supervision. I was immediately available for direct supervision, assistance and direction throughout this encounter.  I also confirm that I have verified the information documented in the resident's note, and that I have also personally reperformed the pertinent components of the physical exam and all of the medical decision making activities.  I have also made any necessary editorial changes.  #labor: patient received cytotec x1 just before 0800. Went to place FB and patient found to be 3/50/-1. Reviewed R/B/A to AROM. Patient gave verbal consent for AROM. AROM with small amount of clear fluid. Patient and fetus tolerated procedure well. Will reassess in 2-3 hours and if no change can augment with Pitocin  #cHTN: BP's stable. Labs unremarkable. Patient asymptomatic. Continue to monitor  DRenee Harder CNM 07/28/2021 10:04 AM

## 2021-07-28 NOTE — Progress Notes (Signed)
Anne Phelps is a 36 y.o. G2P1001 at [redacted]w[redacted]d for IOL for cHTN.   Subjective: Anne Phelps is comfortable with epidural. No concerns or questions at this time.   Objective: BP 127/64   Pulse 67   Temp 98 F (36.7 C) (Oral)   Resp 16   Ht 5\' 5"  (1.651 m)   Wt 81.7 kg   LMP 10/28/2020   SpO2 99%   BMI 29.97 kg/m  No intake/output data recorded. No intake/output data recorded.  FHT: 130 bpm, moderate variability, +15x15 accels, no decels UC: Q 2-5 mins SVE:   Dilation: 3.5 Effacement (%): 50 Station: -1 Exam by:: [email or web address], RN  Labs: Lab Results  Component Value Date   WBC 11.7 (H) 07/28/2021   HGB 12.0 07/28/2021   HCT 35.1 (L) 07/28/2021   MCV 85.8 07/28/2021   PLT 224 07/28/2021    Assessment / Plan: 07/30/2021 35 y.o. G2P1001 at [redacted]w[redacted]d admitted for IOL d/t cHTN.  Labor: Progressing. Continue Pitocin titration prn per unit policy cHTN: BP's normotensive, labs unremarkable. Asymptomatic. Continue to monitor Fetal Wellbeing:  Category I Pain Control:  Epidural I/D:  GBS neg Anticipated MOD:  NSVD   [redacted]w[redacted]d, CNM 07/28/2021, 1:53 PM

## 2021-07-28 NOTE — Anesthesia Preprocedure Evaluation (Signed)
Anesthesia Evaluation  Patient identified by MRN, date of birth, ID band Patient awake    Reviewed: Allergy & Precautions, NPO status , Patient's Chart, lab work & pertinent test results  History of Anesthesia Complications Negative for: history of anesthetic complications  Airway Mallampati: II   Neck ROM: Full    Dental   Pulmonary neg pulmonary ROS,    Pulmonary exam normal        Cardiovascular hypertension (PIH), Normal cardiovascular exam     Neuro/Psych PSYCHIATRIC DISORDERS Depression negative neurological ROS     GI/Hepatic negative GI ROS, Neg liver ROS,   Endo/Other  diabetes, Gestational  Renal/GU negative Renal ROS     Musculoskeletal  (+) Arthritis ,   Abdominal   Peds  Hematology negative hematology ROS (+)   Anesthesia Other Findings   Reproductive/Obstetrics (+) Pregnancy                             Anesthesia Physical Anesthesia Plan  ASA: 2  Anesthesia Plan: Epidural   Post-op Pain Management:    Induction:   PONV Risk Score and Plan: 2 and Treatment may vary due to age or medical condition  Airway Management Planned: Natural Airway  Additional Equipment: None  Intra-op Plan:   Post-operative Plan:   Informed Consent: I have reviewed the patients History and Physical, chart, labs and discussed the procedure including the risks, benefits and alternatives for the proposed anesthesia with the patient or authorized representative who has indicated his/her understanding and acceptance.       Plan Discussed with: Anesthesiologist  Anesthesia Plan Comments: (Labs reviewed. Platelets acceptable, patient not taking any blood thinning medications. Per RN, FHR tracing reported to be stable enough for sitting procedure. Risks and benefits discussed with patient, including PDPH, backache, epidural hematoma, failed epidural, blood pressure changes, allergic  reaction, and nerve injury. Patient expressed understanding and wished to proceed.)        Anesthesia Quick Evaluation

## 2021-07-28 NOTE — Anesthesia Procedure Notes (Signed)
Epidural Patient location during procedure: OB Start time: 07/28/2021 11:15 AM End time: 07/28/2021 11:18 AM  Staffing Anesthesiologist: Beryle Lathe, MD Performed: anesthesiologist   Preanesthetic Checklist Completed: patient identified, IV checked, risks and benefits discussed, monitors and equipment checked, pre-op evaluation and timeout performed  Epidural Patient position: sitting Prep: DuraPrep Patient monitoring: continuous pulse ox and blood pressure Approach: midline Location: L2-L3 Injection technique: LOR saline  Needle:  Needle type: Tuohy  Needle gauge: 17 G Needle length: 9 cm Needle insertion depth: 5 cm Catheter size: 19 Gauge Catheter at skin depth: 10 cm Test dose: negative and Other (1% lidocaine)  Assessment Events: blood not aspirated  Additional Notes Patient identified. Risks including, but not limited to, bleeding, infection, nerve damage, paralysis, inadequate analgesia, blood pressure changes, nausea, vomiting, allergic reaction, postpartum back pain, itching, and headache were discussed. Patient expressed understanding and wished to proceed. Sterile prep and drape, including hand hygiene, mask, and sterile gloves were used. The patient was positioned and the spine was prepped. The skin was anesthetized with lidocaine. No paraesthesia or other complication noted. The patient did not experience any signs of intravascular injection such as tinnitus or metallic taste in mouth, nor signs of intrathecal spread such as rapid motor block. Please see nursing notes for vital signs. The patient tolerated the procedure well.   Leslye Peer, MDReason for block:procedure for pain

## 2021-07-28 NOTE — Discharge Summary (Signed)
Postpartum Discharge Summary      Patient Name: Anne Phelps DOB: [birth year] MRN: [id number]  Date of admission: 07/28/2021 Delivery date:07/28/2021  Delivering provider: Renee Harder  Date of discharge: 07/29/2021  Admitting diagnosis: Chronic hypertension affecting pregnancy [O10.919] Intrauterine pregnancy: [redacted]w[redacted]d    Secondary diagnosis:  Principal Problem:   Chronic hypertension affecting pregnancy Active Problems:   Rh negative state in antepartum period   Supervision of high risk pregnancy, antepartum   Gestational diabetes mellitus (GDM), antepartum   SVD (spontaneous vaginal delivery)  Additional problems: none    Discharge diagnosis: Term Pregnancy Delivered, CHTN, and GDM A1                                              Post partum procedures:rhogam Augmentation: AROM, Pitocin, and Cytotec Complications: None  Hospital course: Induction of Labor With Vaginal Delivery   36y.o. yo G2P1001 at 352w0das admitted to the hospital 07/28/2021 for induction of labor.  Indication for induction:  cHTN  (also has GDMA1).  Patient had an uncomplicated labor course as follows: Membrane Rupture Time/Date: 9:58 AM ,07/28/2021   Delivery Method:Vaginal, Spontaneous  Episiotomy: None  Lacerations:  None  Details of delivery can be found in separate delivery note.  Patient had a routine postpartum course. Her fasting CBG on PPD#1 was 85. Her BPs on MBU were normotensive but she was started on Lasix 2017m 5d due to the cHTN. Patient is discharged home 07/29/21 per her request for early d/c as long as the baby can go as well.  Newborn Data: Birth date:07/28/2021  Birth time:5:34 PM  Gender:Female  Living status:Living  Apgars:7 ,9  Weight:2790 g (6lb 2.4oz)  Magnesium Sulfate received: No BMZ received: No Rhophylac:Yes MMR:N/A T-DaP:Given prenatally Flu: Yes Transfusion:No  Physical exam  Vitals:   07/28/21 1902 07/28/21 1932 07/29/21 0130 07/29/21 0605  BP:  123/82 127/66 129/79 131/81  Pulse: 71 71 71 73  Resp:  '15 16 16  ' Temp:  98 F (36.7 C) 98.1 F (36.7 C) 98.4 F (36.9 C)  TempSrc:  Oral  Oral  SpO2:   100% 98%  Weight:      Height:       General: alert and cooperative Lochia: appropriate Uterine Fundus: firm Incision: N/A DVT Evaluation: No evidence of DVT seen on physical exam. Labs: Lab Results  Component Value Date   WBC 18.8 (H) 07/28/2021   HGB 11.1 (L) 07/28/2021   HCT 33.3 (L) 07/28/2021   MCV 85.8 07/28/2021   PLT 213 07/28/2021      Latest Ref Rng & Units 07/28/2021    7:42 AM  CMP  Glucose 70 - 99 mg/dL 89   BUN 6 - 20 mg/dL 8   Creatinine 0.44 - 1.00 mg/dL 0.53   Sodium 135 - 145 mmol/L 139   Potassium 3.5 - 5.1 mmol/L 3.6   Chloride 98 - 111 mmol/L 107   CO2 22 - 32 mmol/L 23   Calcium 8.9 - 10.3 mg/dL 9.1   Total Protein 6.5 - 8.1 g/dL 6.2   Total Bilirubin 0.3 - 1.2 mg/dL 0.3   Alkaline Phos 38 - 126 U/L 110   AST 15 - 41 U/L 20   ALT 0 - 44 U/L 15    Edinburgh Score:     No data to display  After visit meds:  Allergies as of 07/29/2021       Reactions   Penicillins Swelling, Rash   Throat swells        Medication List     STOP taking these medications    aspirin EC 81 MG tablet   ferrous gluconate 324 MG tablet Commonly known as: FERGON   ONE TOUCH CLUB LANCETS Misc   OneTouch Verio test strip Generic drug: glucose blood   OneTouch Verio w/Device Kit       TAKE these medications    furosemide 20 MG tablet Commonly known as: LASIX Take 1 tablet (20 mg total) by mouth daily. Start taking on: July 30, 2021   ibuprofen 600 MG tablet Commonly known as: ADVIL Take 1 tablet (600 mg total) by mouth every 6 (six) hours as needed.   prenatal vitamin w/FE, FA 27-1 MG Tabs tablet Take 1 tablet by mouth daily at 12 noon.         Discharge home in stable condition Infant Feeding: Bottle Infant Disposition:home with mother Discharge instruction: per  After Visit Summary and Postpartum booklet. Activity: Advance as tolerated. Pelvic rest for 6 weeks.  Diet: routine diet Future Appointments: Future Appointments  Date Time Provider [place]  09/08/2021  8:35 AM Donnamae Jude, MD [facility]   Follow up Visit:  Message sent to [facility] on 6/14 by [name], CNM  Please schedule this patient for a In person postpartum visit in 6 weeks with the following provider: Any provider. Additional Postpartum F/U:BP check 1 week & 2hr GTT @ PP visit High risk pregnancy complicated by: HTN Delivery mode:  Vaginal, Spontaneous  Anticipated Birth Control:  OCPs   07/29/2021 [name], CNM 8:12 AM

## 2021-07-28 NOTE — Plan of Care (Signed)
  Problem: Education: Goal: Knowledge of General Education information will improve Description: Including pain rating scale, medication(s)/side effects and non-pharmacologic comfort measures Outcome: Completed/Met   Problem: Health Behavior/Discharge Planning: Goal: Ability to manage health-related needs will improve Outcome: Completed/Met   Problem: Clinical Measurements: Goal: Ability to maintain clinical measurements within normal limits will improve Outcome: Completed/Met Goal: Will remain free from infection Outcome: Completed/Met Goal: Diagnostic test results will improve Outcome: Completed/Met Goal: Respiratory complications will improve Outcome: Completed/Met Goal: Cardiovascular complication will be avoided Outcome: Completed/Met   Problem: Activity: Goal: Risk for activity intolerance will decrease Outcome: Completed/Met   Problem: Nutrition: Goal: Adequate nutrition will be maintained Outcome: Completed/Met   Problem: Coping: Goal: Level of anxiety will decrease Outcome: Completed/Met   Problem: Elimination: Goal: Will not experience complications related to bowel motility Outcome: Completed/Met Goal: Will not experience complications related to urinary retention Outcome: Completed/Met   Problem: Pain Managment: Goal: General experience of comfort will improve Outcome: Completed/Met   Problem: Safety: Goal: Ability to remain free from injury will improve Outcome: Completed/Met   Problem: Skin Integrity: Goal: Risk for impaired skin integrity will decrease Outcome: Completed/Met   Problem: Education: Goal: Knowledge of Childbirth will improve Outcome: Completed/Met Goal: Ability to make informed decisions regarding treatment and plan of care will improve Outcome: Completed/Met Goal: Ability to state and carry out methods to decrease the pain will improve Outcome: Completed/Met Goal: Individualized Educational Video(s) Outcome: Completed/Met    Problem: Coping: Goal: Ability to verbalize concerns and feelings about labor and delivery will improve Outcome: Completed/Met   Problem: Life Cycle: Goal: Ability to make normal progression through stages of labor will improve Outcome: Completed/Met Goal: Ability to effectively push during vaginal delivery will improve Outcome: Completed/Met   Problem: Role Relationship: Goal: Will demonstrate positive interactions with the child Outcome: Completed/Met   Problem: Safety: Goal: Risk of complications during labor and delivery will decrease Outcome: Completed/Met   Problem: Pain Management: Goal: Relief or control of pain from uterine contractions will improve Outcome: Completed/Met   Problem: Education: Goal: Knowledge of disease or condition will improve Outcome: Completed/Met Goal: Knowledge of the prescribed therapeutic regimen will improve Outcome: Completed/Met   Problem: Fluid Volume: Goal: Peripheral tissue perfusion will improve Outcome: Completed/Met   Problem: Clinical Measurements: Goal: Complications related to disease process, condition or treatment will be avoided or minimized Outcome: Completed/Met   Problem: Education: Goal: Ability to describe self-care measures that may prevent or decrease complications (Diabetes Survival Skills Education) will improve Outcome: Completed/Met Goal: Individualized Educational Video(s) Outcome: Completed/Met   Problem: Coping: Goal: Ability to adjust to condition or change in health will improve Outcome: Completed/Met   Problem: Fluid Volume: Goal: Ability to maintain a balanced intake and output will improve Outcome: Completed/Met   Problem: Health Behavior/Discharge Planning: Goal: Ability to identify and utilize available resources and services will improve Outcome: Completed/Met Goal: Ability to manage health-related needs will improve Outcome: Completed/Met   Problem: Metabolic: Goal: Ability to maintain  appropriate glucose levels will improve Outcome: Completed/Met   Problem: Nutritional: Goal: Maintenance of adequate nutrition will improve Outcome: Completed/Met Goal: Progress toward achieving an optimal weight will improve Outcome: Completed/Met   Problem: Skin Integrity: Goal: Risk for impaired skin integrity will decrease Outcome: Completed/Met   Problem: Tissue Perfusion: Goal: Adequacy of tissue perfusion will improve Outcome: Completed/Met

## 2021-07-29 LAB — GLUCOSE, CAPILLARY: Glucose-Capillary: 85 mg/dL (ref 70–99)

## 2021-07-29 MED ORDER — DIBUCAINE (PERIANAL) 1 % EX OINT: 1.0000 "application " | TOPICAL_OINTMENT | CUTANEOUS | Status: DC | PRN

## 2021-07-29 MED ORDER — SIMETHICONE 80 MG PO CHEW: 80.0000 mg | CHEWABLE_TABLET | ORAL | Status: DC | PRN

## 2021-07-29 MED ORDER — TETANUS-DIPHTH-ACELL PERTUSSIS 5-2.5-18.5 LF-MCG/0.5 IM SUSY: 0.5000 mL | PREFILLED_SYRINGE | Freq: Once | INTRAMUSCULAR | Status: DC

## 2021-07-29 MED ORDER — SENNOSIDES-DOCUSATE SODIUM 8.6-50 MG PO TABS
2.0000 | ORAL_TABLET | Freq: Every day | ORAL | Status: DC
  Filled 2021-07-29: qty 2

## 2021-07-29 MED ORDER — ONDANSETRON HCL 4 MG/2ML IJ SOLN: 4.0000 mg | INTRAMUSCULAR | Status: DC | PRN

## 2021-07-29 MED ORDER — FUROSEMIDE 20 MG PO TABS
20.0000 mg | ORAL_TABLET | Freq: Every day | ORAL | Status: DC
  Administered 2021-07-29: 20 mg via ORAL
  Filled 2021-07-29: qty 1

## 2021-07-29 MED ORDER — IBUPROFEN 600 MG PO TABS: 600.0000 mg | ORAL_TABLET | Freq: Four times a day (QID) | ORAL | 0 refills | Status: DC | PRN

## 2021-07-29 MED ORDER — IBUPROFEN 600 MG PO TABS
600.0000 mg | ORAL_TABLET | Freq: Four times a day (QID) | ORAL | Status: DC
  Administered 2021-07-29 (×2): 600 mg via ORAL
  Filled 2021-07-29 (×2): qty 1

## 2021-07-29 MED ORDER — COCONUT OIL OIL: 1.0000 "application " | TOPICAL_OIL | Status: DC | PRN

## 2021-07-29 MED ORDER — BENZOCAINE-MENTHOL 20-0.5 % EX AERO: 1.0000 "application " | INHALATION_SPRAY | CUTANEOUS | Status: DC | PRN

## 2021-07-29 MED ORDER — FUROSEMIDE 20 MG PO TABS
20.0000 mg | ORAL_TABLET | Freq: Every day | ORAL | 0 refills | Status: DC
Start: 2021-07-30 — End: 2021-08-05

## 2021-07-29 MED ORDER — WITCH HAZEL-GLYCERIN EX PADS: 1.0000 "application " | MEDICATED_PAD | CUTANEOUS | Status: DC | PRN

## 2021-07-29 MED ORDER — RHO D IMMUNE GLOBULIN 1500 UNIT/2ML IJ SOSY
300.0000 ug | PREFILLED_SYRINGE | Freq: Once | INTRAMUSCULAR | Status: AC
Start: 2021-07-29 — End: 2021-07-29
  Administered 2021-07-29: 300 ug via INTRAVENOUS
  Filled 2021-07-29: qty 2

## 2021-07-29 MED ORDER — PRENATAL MULTIVITAMIN CH
1.0000 | ORAL_TABLET | Freq: Every day | ORAL | Status: DC
  Administered 2021-07-29: 1 via ORAL
  Filled 2021-07-29: qty 1

## 2021-07-29 MED ORDER — DIPHENHYDRAMINE HCL 25 MG PO CAPS: 25.0000 mg | ORAL_CAPSULE | Freq: Four times a day (QID) | ORAL | Status: DC | PRN

## 2021-07-29 MED ORDER — ONDANSETRON HCL 4 MG PO TABS: 4.0000 mg | ORAL_TABLET | ORAL | Status: DC | PRN

## 2021-07-29 MED ORDER — ACETAMINOPHEN 325 MG PO TABS
650.0000 mg | ORAL_TABLET | ORAL | Status: DC | PRN
  Administered 2021-07-29 (×2): 650 mg via ORAL
  Filled 2021-07-29 (×2): qty 2

## 2021-07-29 MED ORDER — ZOLPIDEM TARTRATE 5 MG PO TABS: 5.0000 mg | ORAL_TABLET | Freq: Every evening | ORAL | Status: DC | PRN

## 2021-07-29 NOTE — Social Work (Signed)
CSW received consult for hx. PPD. CSW met with MOB to offer support and complete assessment.    CSW met with MOB at bedside and introduced CSW role. CSW observed MOB sitting on the couch and the infant asleep in the bassinet. MOB presented calm and receptive to [place] visit. CSW explained the reason for the visit. MOB expressed she had postpartum depression in 2015 following the birth of daughter. MOB reported it was her first child and felt she was not prepared. MOB expressed this time her spouse is more hands on, she has a better understanding of the infant's feeding cues and will have support from her mom who is now retired. CSW acknowledged MOB efforts. MOB reported she feels good and had no additional concerns. CSW assessed MOB for safety. MOB denied thoughts of harm to self and others.   CSW provided education regarding the baby blues period vs. perinatal mood disorders, discussed treatment and gave resources for mental health follow up if concerns arise.  CSW recommended self-evaluation during the postpartum time period using the New Mom Checklist from Postpartum Progress and encouraged MOB to contact a medical professional if symptoms are noted at any time.    MOB reported she has essential items for the infant including a basinet where the infant will sleep. CSW provided review of Sudden Infant Death Syndrome (SIDS) precautions. MOB has chosen [facility] for the infant's follow up care. CSW assessed MOB for additional needs. MOB reported no further need.   CSW identifies no further need for intervention and no barriers to discharge at this time.   Anne Phelps, MSW, LCSW [facility] [place] Worker  [phone] 07/29/2021  12:19 PM

## 2021-07-29 NOTE — Anesthesia Postprocedure Evaluation (Signed)
Anesthesia Post Note  Patient: Anne Phelps  Procedure(s) Performed: AN AD HOC LABOR EPIDURAL     Patient location during evaluation: Mother Baby Anesthesia Type: Epidural Level of consciousness: awake Pain management: satisfactory to patient Vital Signs Assessment: post-procedure vital signs reviewed and stable Respiratory status: spontaneous breathing Cardiovascular status: stable Anesthetic complications: no   No notable events documented.  Last Vitals:  Vitals:   07/29/21 0130 07/29/21 0605  BP: 129/79 131/81  Pulse: 71 73  Resp: 16 16  Temp: 36.7 C 36.9 C  SpO2: 100% 98%    Last Pain:  Vitals:   07/29/21 0605  TempSrc: Oral  PainSc:    Pain Goal:                   Cephus Shelling

## 2021-07-30 LAB — RH IG WORKUP (INCLUDES ABO/RH)
Fetal Screen: NEGATIVE
Gestational Age(Wks): 39
Unit division: 0

## 2021-07-30 LAB — SURGICAL PATHOLOGY

## 2021-08-05 ENCOUNTER — Inpatient Hospital Stay (HOSPITAL_COMMUNITY)
Admission: AD | Admit: 2021-08-05 | Discharge: 2021-08-05 | Disposition: A | Discharge status: Home / Self Care | Payer: BC Managed Care – PPO | Attending: Family Medicine | Admitting: Family Medicine

## 2021-08-05 ENCOUNTER — Encounter (HOSPITAL_COMMUNITY): Payer: Self-pay | Admitting: Family Medicine

## 2021-08-05 ENCOUNTER — Ambulatory Visit (INDEPENDENT_AMBULATORY_CARE_PROVIDER_SITE_OTHER): Payer: BC Managed Care – PPO

## 2021-08-05 VITALS — BP 161/94 | HR 83

## 2021-08-05 DIAGNOSIS — O1093 Unspecified pre-existing hypertension complicating the puerperium: Secondary | ICD-10-CM | POA: Diagnosis not present

## 2021-08-05 DIAGNOSIS — O10919 Unspecified pre-existing hypertension complicating pregnancy, unspecified trimester: Secondary | ICD-10-CM

## 2021-08-05 DIAGNOSIS — Z79899 Other long term (current) drug therapy: Secondary | ICD-10-CM | POA: Diagnosis not present

## 2021-08-05 LAB — COMPREHENSIVE METABOLIC PANEL
ALT: 35 U/L (ref 0–44)
AST: 40 U/L (ref 15–41)
Albumin: 3.8 g/dL (ref 3.5–5.0)
Alkaline Phosphatase: 98 U/L (ref 38–126)
Anion gap: 12 (ref 5–15)
BUN: 9 mg/dL (ref 6–20)
CO2: 23 mmol/L (ref 22–32)
Calcium: 9.8 mg/dL (ref 8.9–10.3)
Chloride: 106 mmol/L (ref 98–111)
Creatinine, Ser: 0.62 mg/dL (ref 0.44–1.00)
GFR, Estimated: >60 mL/min (ref >60)
Glucose, Bld: 92 mg/dL (ref 70–99)
Potassium: 4.5 mmol/L (ref 3.5–5.1)
Sodium: 141 mmol/L (ref 135–145)
Total Bilirubin: 1 mg/dL (ref 0.3–1.2)
Total Protein: 7.8 g/dL (ref 6.5–8.1)

## 2021-08-05 LAB — CBC
HCT: 41.3 % (ref 36.0–46.0)
Hemoglobin: 13.8 g/dL (ref 12.0–15.0)
MCH: 29.3 pg (ref 26.0–34.0)
MCHC: 33.4 g/dL (ref 30.0–36.0)
MCV: 87.7 fL (ref 80.0–100.0)
Platelets: 368 10*3/uL (ref 150–400)
RBC: 4.71 MIL/uL (ref 3.87–5.11)
RDW: 13.2 % (ref 11.5–15.5)
WBC: 11.2 10*3/uL — ABNORMAL HIGH (ref 4.0–10.5)
nRBC: 0 % (ref 0.0–0.2)

## 2021-08-05 MED ORDER — NIFEDIPINE ER OSMOTIC RELEASE 30 MG PO TB24: 30.0000 mg | ORAL_TABLET | Freq: Every day | ORAL | 0 refills | Status: DC

## 2021-08-05 MED ORDER — LABETALOL HCL 5 MG/ML IV SOLN: 80.0000 mg | INTRAVENOUS | Status: DC | PRN

## 2021-08-05 MED ORDER — LABETALOL HCL 5 MG/ML IV SOLN
INTRAVENOUS | Status: AC
  Administered 2021-08-05: 20 mg via INTRAVENOUS
  Filled 2021-08-05: qty 4

## 2021-08-05 MED ORDER — LABETALOL HCL 5 MG/ML IV SOLN: 20.0000 mg | INTRAVENOUS | Status: DC | PRN

## 2021-08-05 MED ORDER — HYDRALAZINE HCL 20 MG/ML IJ SOLN: 10.0000 mg | INTRAMUSCULAR | Status: DC | PRN

## 2021-08-05 MED ORDER — LABETALOL HCL 5 MG/ML IV SOLN: 40.0000 mg | INTRAVENOUS | Status: DC | PRN

## 2021-08-05 NOTE — MAU Note (Signed)
.  Anne Phelps is a 36 y.o. 1 week postpartum vaginal delivery here in MAU reporting: sent from the office for severe range blood pressure. Denies any s/sx of PEC. No pain.

## 2021-08-05 NOTE — MAU Provider Note (Signed)
History   \ CSN: [id number]  Arrival date and time: 08/05/21 1052  Event Date/Time  First Provider Initiated Contact with Patient 08/05/21 1115      Chief Complaint  Patient presents with   Hypertension   HPI Anne Phelps is a 36 y.o. [id number] postpartum patient who presents to MAU from [facility] for evaluation of new onset severe range blood pressures. Patient is s/p vaginal delivery on 07/28/2021. Her pregnancy was complicated by Chronic Hypertension, not on medication. She denies headache, visual disturbances, RUQ/epigastric pain, new onset swelling or weight gain.  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[place])     Past Surgical History:  Procedure Laterality Date   NO PAST SURGERIES      Family History  Problem Relation Age of Onset   Diabetes Maternal Grandmother    Hypertension Maternal Grandmother    Stroke Maternal Grandmother    Heart attack Father    Hypertension Father    Hypertension Mother    Hypercholesterolemia Mother    Psoriasis Mother    Hypertension Maternal Grandfather    Hyperlipidemia Maternal Grandfather    Hyperlipidemia Paternal Grandfather    Hypertension Paternal Grandfather    Heart disease Paternal Grandfather    Stroke Paternal Grandfather    Breast cancer Cousin 20    Social History   Tobacco Use   Smoking status: Never   Smokeless tobacco: Never  Vaping Use   Vaping Use: Never used  Substance Use Topics   Alcohol use: No    Alcohol/week: 0.0 standard drinks of alcohol   Drug use: No    Allergies:  Allergies  Allergen Reactions   Penicillins Swelling and Rash    Throat swells    Medications Prior to Admission   Medication Sig Dispense Refill Last Dose   furosemide (LASIX) 20 MG tablet Take 1 tablet (20 mg total) by mouth daily. 3 tablet 0 Past Week   ibuprofen (ADVIL) 600 MG tablet Take 1 tablet (600 mg total) by mouth every 6 (six) hours as needed. 30 tablet 0 Past Week   prenatal vitamin w/FE, FA (PRENATAL 1 + 1) 27-1 MG TABS tablet Take 1 tablet by mouth daily at 12 noon.   08/04/2021    Review of Systems  Eyes:  Negative for visual disturbance.  Gastrointestinal:  Negative for abdominal pain.  Neurological:  Negative for headaches.  All other systems reviewed and are negative.  Physical Exam   Blood pressure (!) 160/98, pulse 78, temperature 98 F (36.7 C), temperature source Oral, resp. rate 18, unknown if currently breastfeeding.  Physical Exam Vitals and nursing note reviewed. Exam conducted with a chaperone present.  Constitutional:      Appearance: Normal appearance. She is not ill-appearing.  HENT:     Mouth/Throat:     Mouth: Mucous membranes are moist.  Eyes:     Pupils: Pupils are equal, round, and reactive to light.  Cardiovascular:     Rate and Rhythm: Normal rate and regular rhythm.     Pulses: Normal pulses.     Heart sounds: Normal heart sounds.  Pulmonary:     Effort: Pulmonary effort is normal.     Breath sounds: Normal breath sounds.  Skin:    Capillary Refill: Capillary refill takes less than 2 seconds.  Neurological:     Mental Status: She is alert and oriented to person, place, and time.  Psychiatric:        Mood and Affect: Mood normal.        Behavior: Behavior normal.        Thought Content: Thought content normal.        Judgment: Judgment normal.     MAU Course  Procedures  MDM - EMR reviewed: pregnancy c/b CHTN but medication not required.  - Normotensive during inpatient course except when in active labor - Documented severe range during BP check at [facility] this morning PTA: 171/107, 161/94 - Severe range on arrival to MAU. IV start  and Labetalol Protocol ordered with PEC labs - Normotensive in MAU following IV Labetalol 20 mg x 1 administration - PEC labs WNL - No severe symptoms at any time  - Discussed with Dr. Crissie Reese prior to patient discharge. He agrees patient is appropriate for initiation of Procardia XL 30 mg daily.   Orders Placed This Encounter  Procedures   CBC   Comprehensive metabolic panel   Measure blood pressure   Notify physician (specify) Confirmatory reading of BP> 160/110 15 minutes later   Measure blood pressure   Insert peripheral IV   Saline lock IV   Patient Vitals for the past 24 hrs:  BP Temp Temp src Pulse Resp  08/05/21 1221 116/71 -- -- (!) 55 --  08/05/21 1211 115/64 -- -- (!) 55 --  08/05/21 1201 118/72 -- -- (!) 55 --  08/05/21 1151 118/77 -- -- (!) 59 --  08/05/21 1141 125/78 -- -- 62 --  08/05/21 1136 130/76 -- -- 66 --  08/05/21 1114 (!) 160/98 98 F (36.7 C) Oral 78 18    Results for orders placed or performed during the hospital encounter of 08/05/21 (from the past 24 hour(s))  CBC     Status: Abnormal   Collection Time: 08/05/21 11:13 AM  Result Value Ref Range   WBC 11.2 (H) 4.0 - 10.5 K/uL   RBC 4.71 3.87 - 5.11 MIL/uL   Hemoglobin 13.8 12.0 - 15.0 g/dL   HCT 67.6 19.5 - 09.3 %   MCV 87.7 80.0 - 100.0 fL   MCH 29.3 26.0 - 34.0 pg   MCHC 33.4 30.0 - 36.0 g/dL   RDW 26.7 12.4 - 58.0 %   Platelets 368 150 - 400 K/uL   nRBC 0.0 0.0 - 0.2 %  Comprehensive metabolic panel     Status: None   Collection Time: 08/05/21 11:13 AM  Result Value Ref Range   Sodium 141 135 - 145 mmol/L   Potassium 4.5 3.5 - 5.1 mmol/L   Chloride 106 98 - 111 mmol/L   CO2 23 22 - 32 mmol/L   Glucose, Bld 92 70 - 99 mg/dL   BUN 9 6 - 20 mg/dL   Creatinine, Ser 9.98 0.44 - 1.00 mg/dL   Calcium 9.8 8.9 - 33.8 mg/dL   Total Protein 7.8 6.5 - 8.1 g/dL   Albumin 3.8 3.5 - 5.0 g/dL   AST 40 15 - 41 U/L  ALT 35 0 - 44 U/L   Alkaline Phosphatase 98 38 - 126 U/L   Total Bilirubin 1.0 0.3  - 1.2 mg/dL   GFR, Estimated >16 >10 mL/min   Anion gap 12 5 - 15   Meds ordered this encounter  Medications   AND Linked Order Group    labetalol (NORMODYNE) injection 20 mg    labetalol (NORMODYNE) injection 40 mg    labetalol (NORMODYNE) injection 80 mg    hydrALAZINE (APRESOLINE) injection 10 mg   labetalol (NORMODYNE) 5 MG/ML injection    Flippin, Holly E: cabinet override   NIFEdipine (PROCARDIA-XL/NIFEDICAL-XL) 30 MG 24 hr tablet    Sig: Take 1 tablet (30 mg total) by mouth daily. Can increase to twice a day as needed for symptomatic contractions    Dispense:  30 tablet    Refill:  0    Order Specific Question:   Supervising Provider    Answer:   Venora Maples [[id number]]   Assessment and Plan  --36 y.o. [id number] s/p vaginal birth 07/28/2021 --Chronic Hypertension --PEC labs WNL --Initiate Procardia XL 30 mg daily --Discharge home in stable condition  F/U: --Secure chat conducted with [facility] staff to coordinate BP check in office tomorrow 08/06/21  Calvert Cantor, MSA, MSN, CNM 08/05/2021, 2:36 PM

## 2021-08-06 ENCOUNTER — Ambulatory Visit (INDEPENDENT_AMBULATORY_CARE_PROVIDER_SITE_OTHER): Payer: BC Managed Care – PPO | Admitting: *Deleted

## 2021-08-06 DIAGNOSIS — O10919 Unspecified pre-existing hypertension complicating pregnancy, unspecified trimester: Secondary | ICD-10-CM

## 2021-08-12 ENCOUNTER — Other Ambulatory Visit: Payer: Self-pay | Admitting: *Deleted

## 2021-08-12 MED ORDER — NIFEDIPINE ER OSMOTIC RELEASE 30 MG PO TB24: 30.0000 mg | ORAL_TABLET | Freq: Every day | ORAL | 1 refills | Status: DC

## 2021-08-13 ENCOUNTER — Other Ambulatory Visit: Payer: Self-pay | Admitting: Obstetrics & Gynecology

## 2021-08-13 DIAGNOSIS — O99013 Anemia complicating pregnancy, third trimester: Secondary | ICD-10-CM

## 2021-09-08 ENCOUNTER — Ambulatory Visit (INDEPENDENT_AMBULATORY_CARE_PROVIDER_SITE_OTHER): Payer: BC Managed Care – PPO | Admitting: Family Medicine

## 2021-09-08 VITALS — BP 110/69 | HR 86 | Wt 163.0 lb

## 2021-09-08 DIAGNOSIS — Z30011 Encounter for initial prescription of contraceptive pills: Secondary | ICD-10-CM

## 2021-09-08 DIAGNOSIS — I10 Essential (primary) hypertension: Secondary | ICD-10-CM

## 2021-09-08 DIAGNOSIS — O24410 Gestational diabetes mellitus in pregnancy, diet controlled: Secondary | ICD-10-CM | POA: Diagnosis not present

## 2021-09-08 MED ORDER — NORETHIN ACE-ETH ESTRAD-FE 1-20 MG-MCG PO TABS: 1.0000 | ORAL_TABLET | Freq: Every day | ORAL | 3 refills | Status: DC

## 2021-09-08 NOTE — Progress Notes (Signed)
Post Partum Visit Note  Anne Phelps is a 36 y.o. G3P2002 female who presents for a postpartum visit. She is 6 weeks postpartum following a normal spontaneous vaginal delivery.  I have fully reviewed the prenatal and intrapartum course. The delivery was at 39.0 gestational weeks.  Anesthesia: epidural. Postpartum course has been complicated with postpartum hypertension. Baby is doing well. Baby is feeding by bottle - similac advanced . Bleeding no bleeding. Bowel function is normal. Bladder function is normal. Patient is not sexually active. Contraception method is OCP (estrogen/progesterone). Postpartum depression screening: negative.   The pregnancy intention screening data noted above was reviewed. Potential methods of contraception were discussed. The patient elected to proceed with No data recorded.   Edinburgh Postnatal Depression Scale - 09/08/21 0848       Edinburgh Postnatal Depression Scale:  In the Past 7 Days   I have been able to laugh and see the funny side of things. 0    I have looked forward with enjoyment to things. 0    I have blamed myself unnecessarily when things went wrong. 1    I have been anxious or worried for no good reason. 2    I have felt scared or panicky for no good reason. 1    Things have been getting on top of me. 0    I have been so unhappy that I have had difficulty sleeping. 0    I have felt sad or miserable. 0    I have been so unhappy that I have been crying. 0    The thought of harming myself has occurred to me. 0    Edinburgh Postnatal Depression Scale Total 4             Health Maintenance Due  Topic Date Due   URINE MICROALBUMIN  Never done    The following portions of the patient's history were reviewed and updated as appropriate: allergies, current medications, past family history, past medical history, past social history, past surgical history, and problem list.  Review of Systems Pertinent items noted in HPI and remainder  of comprehensive ROS otherwise negative.  Objective:  BP 110/69   Pulse 86   Wt 163 lb (73.9 kg)   Breastfeeding No   BMI 27.12 kg/m    General:  alert, cooperative, and appears stated age  Lungs: Normal effort  Heart:  regular rate and rhythm  Abdomen: soft, non-tender; bowel sounds normal; no masses,  no organomegaly        Assessment:    1. Diet controlled gestational diabetes mellitus (GDM), antepartum For 2 hour today   Normal postpartum exam.   Plan:   Essential components of care per ACOG recommendations:  1.  Mood and well being: Patient with negative depression screening today. Has some anxiety however--if becomes issue, will follow-up. Reviewed local resources for support.  - Patient tobacco use? No.   - hx of drug use? No.    2. Infant care and feeding:  -Patient currently breastmilk feeding? No.  -Social determinants of health (SDOH) reviewed in EPIC. No concerns  3. Sexuality, contraception and birth spacing - Patient does not want a pregnancy in the next year.  Desired family size is 2 children.  - Reviewed reproductive life planning. Reviewed contraceptive methods based on pt preferences and effectiveness.  Patient desired Oral Contraceptive today.   - Discussed birth spacing of 18 months  4. Sleep and fatigue -Encouraged family/partner/community support of 4 hrs of  uninterrupted sleep to help with mood and fatigue  5. Physical Recovery  - Discussed patients delivery and complications. She describes her labor as good. - Patient had a Vaginal, no problems at delivery. Patient had no laceration. Perineal healing reviewed. Patient expressed understanding - Patient has urinary incontinence? No. - Patient is safe to resume physical and sexual activity  6.  Health Maintenance - HM due items addressed Yes - Last pap smear  Diagnosis  Date Value Ref Range Status  08/20/2019 (A)  Final   - Atypical squamous cells of undetermined significance (ASC-US)    Pap smear not done at today's visit.  -Breast Cancer screening indicated? No.   7. Chronic Disease/Pregnancy Condition follow up: Hypertension and Gestational Diabetes 2 hour today Will stop Procardia and see how BP is at home, has cuff--if trends back up, may need to resume Discussed OCPs and effect on BP--may consider IUD  - PCP follow up  Reva Bores, MD [facility], [facility]

## 2021-09-09 LAB — GLUCOSE TOLERANCE, 2 HOURS
Glucose, 2 hour: 136 mg/dL (ref 70–139)
Glucose, GTT - Fasting: 84 mg/dL (ref 70–99)

## 2022-05-10 DIAGNOSIS — J0100 Acute maxillary sinusitis, unspecified: Secondary | ICD-10-CM | POA: Diagnosis not present

## 2022-05-10 DIAGNOSIS — Z20822 Contact with and (suspected) exposure to covid-19: Secondary | ICD-10-CM | POA: Diagnosis not present

## 2022-07-26 ENCOUNTER — Telehealth: Payer: Self-pay | Admitting: *Deleted

## 2022-07-26 ENCOUNTER — Other Ambulatory Visit: Payer: Self-pay

## 2022-07-26 ENCOUNTER — Emergency Department
Admission: EM | Admit: 2022-07-26 | Discharge: 2022-07-26 | Disposition: A | Discharge status: Home / Self Care | Payer: BC Managed Care – PPO | Attending: Emergency Medicine | Admitting: Emergency Medicine

## 2022-07-26 ENCOUNTER — Encounter: Payer: Self-pay | Admitting: Emergency Medicine

## 2022-07-26 DIAGNOSIS — I10 Essential (primary) hypertension: Secondary | ICD-10-CM | POA: Diagnosis not present

## 2022-07-26 DIAGNOSIS — F419 Anxiety disorder, unspecified: Secondary | ICD-10-CM | POA: Insufficient documentation

## 2022-07-26 LAB — BASIC METABOLIC PANEL
Anion gap: 12 (ref 5–15)
BUN: 9 mg/dL (ref 6–20)
CO2: 21 mmol/L — ABNORMAL LOW (ref 22–32)
Calcium: 9.4 mg/dL (ref 8.9–10.3)
Chloride: 102 mmol/L (ref 98–111)
Creatinine, Ser: 0.75 mg/dL (ref 0.44–1.00)
GFR, Estimated: >60 mL/min (ref >60)
Glucose, Bld: 162 mg/dL — ABNORMAL HIGH (ref 70–99)
Potassium: 3.6 mmol/L (ref 3.5–5.1)
Sodium: 135 mmol/L (ref 135–145)

## 2022-07-26 LAB — CBC
HCT: 40 % (ref 36.0–46.0)
Hemoglobin: 13 g/dL (ref 12.0–15.0)
MCH: 27.8 pg (ref 26.0–34.0)
MCHC: 32.5 g/dL (ref 30.0–36.0)
MCV: 85.5 fL (ref 80.0–100.0)
Platelets: 266 10*3/uL (ref 150–400)
RBC: 4.68 MIL/uL (ref 3.87–5.11)
RDW: 12.7 % (ref 11.5–15.5)
WBC: 10.4 10*3/uL (ref 4.0–10.5)
nRBC: 0 % (ref 0.0–0.2)

## 2022-07-26 LAB — TROPONIN I (HIGH SENSITIVITY): Troponin I (High Sensitivity): 3 ng/L (ref <18)

## 2022-07-26 MED ORDER — CHLORTHALIDONE 25 MG PO TABS: 25.0000 mg | ORAL_TABLET | Freq: Every day | ORAL | 2 refills | Status: AC

## 2022-07-26 MED ORDER — FLUOXETINE HCL 10 MG PO CAPS: 10.0000 mg | ORAL_CAPSULE | Freq: Every day | ORAL | 2 refills | Status: AC

## 2022-07-26 NOTE — Telephone Encounter (Signed)
Pt called concerned her blood pressure has been elevated over the past few weeks and has felt anxious as well. Is concerned about the birth control she is on with the elevated BP's. She does have annual on 6/13 with [place] and we discussed we could definitely talk about changing it  and that it would also be good to calla round and find a PCP to help with BP and anxiety as well if needed. Pt verbalized understanding

## 2022-07-26 NOTE — ED Triage Notes (Signed)
Pt sts that she has been having anxiety and hypertension. Pt sts I think that my BP and Anxiety are playing off of each other. Pt sts that she took BP meds 11 post ago after having her child but quit. Pt sts that she thinks that her birth control is affecting this possibly also.

## 2022-07-26 NOTE — ED Notes (Signed)
Pt alert, NAD, calm, interactive, resps e/u, speaking in clear complete sentences, steady gait. Given Rx x2. Out with family.

## 2022-07-26 NOTE — Telephone Encounter (Signed)
-----   Message from Alvin Critchley sent at 07/26/2022  9:17 AM EDT ----- Regarding: Medical Question Pt called in to speak to a nurse. Pt has concerns about her blood pressure.

## 2022-07-26 NOTE — ED Provider Triage Note (Signed)
Emergency Medicine Provider Triage Evaluation Note  Anne Phelps , a 37 y.o. female  was evaluated in triage.  Pt complains of anxiety and elevated blood pressure.  Had gestational hypertension but was discontinued from her medication 11 months ago.  Positive family history for hypertension.  Review of Systems  Positive: anxious Negative: No headache at present, no vision changes, N/V.   Physical Exam  BP (!) 183/112 (BP Location: Left Arm)   Pulse (!) 110   Temp 98 F (36.7 C) (Oral)   Resp 17   Wt 68 kg   LMP 07/13/2022 (Approximate)   SpO2 100%   BMI 24.96 kg/m  Gen:   Awake, no distress  tearful Resp:  Normal effort   Lungs Clear MSK:   Moves extremities without difficulty  Other:    Medical Decision Making  Medically screening exam initiated at 1:59 PM.  Appropriate orders placed.  Anne Phelps was informed that the remainder of the evaluation will be completed by another provider, this initial triage assessment does not replace that evaluation, and the importance of remaining in the ED until their evaluation is complete.     Tommi Rumps, PA-C 07/26/22 1402

## 2022-07-28 ENCOUNTER — Encounter: Payer: Self-pay | Admitting: Advanced Practice Midwife

## 2022-07-28 ENCOUNTER — Ambulatory Visit (INDEPENDENT_AMBULATORY_CARE_PROVIDER_SITE_OTHER): Payer: BC Managed Care – PPO | Admitting: Advanced Practice Midwife

## 2022-07-28 ENCOUNTER — Other Ambulatory Visit (HOSPITAL_COMMUNITY)
Admission: RE | Admit: 2022-07-28 | Discharge: 2022-07-28 | Disposition: A | Discharge status: Home / Self Care | Payer: BC Managed Care – PPO | Source: Ambulatory Visit | Attending: Obstetrics and Gynecology | Admitting: Obstetrics and Gynecology

## 2022-07-28 VITALS — BP 175/109 | HR 102 | Wt 155.0 lb

## 2022-07-28 DIAGNOSIS — Z3009 Encounter for other general counseling and advice on contraception: Secondary | ICD-10-CM

## 2022-07-28 DIAGNOSIS — I10 Essential (primary) hypertension: Secondary | ICD-10-CM | POA: Diagnosis not present

## 2022-07-28 DIAGNOSIS — Z01419 Encounter for gynecological examination (general) (routine) without abnormal findings: Secondary | ICD-10-CM

## 2022-07-28 MED ORDER — SLYND 4 MG PO TABS: 1.0000 | ORAL_TABLET | Freq: Every day | ORAL | 11 refills | Status: AC

## 2022-07-28 NOTE — Progress Notes (Signed)
Since last cycle having break through bleeding and increased anxiety   Has appt with PCP-Wednesday

## 2022-07-29 NOTE — Progress Notes (Signed)
GYNECOLOGY ANNUAL PREVENTATIVE CARE ENCOUNTER NOTE  History:     Anne Phelps is a 37 y.o. G7P2002 female here for a routine annual gynecologic exam.  Current complaints: patient recently presented to the ED for hypertensive crisis. She has been on COCPs for contraception for several years. She has started blood pressure medicine but is also aware she likely needs to change her birth control method.   Denies abnormal vaginal bleeding, discharge, pelvic pain, problems with intercourse or other gynecologic concerns.    Gynecologic History Patient's last menstrual period was 07/13/2022 (approximate). Contraception: OCP (estrogen/progesterone) Last Pap: 08/20/2019. Result was abnormal (ASCUS) with negative HPV Last Mammogram: N/A age 47.    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 30 tablet 2   FLUoxetine (PROZAC) 10 MG capsule Take 1 capsule (10 mg total) by mouth daily. 30 capsule 2   ibuprofen (ADVIL) 600 MG tablet Take 1 tablet (600 mg total) by mouth every 6 (six) hours as needed. 30 tablet 0   NIFEdipine (PROCARDIA-XL/NIFEDICAL-XL) 30 MG 24 hr tablet Take 1 tablet (30 mg total) by mouth daily. 30 tablet 1   prenatal vitamin  w/FE, FA (PRENATAL 1 + 1) 27-1 MG TABS tablet Take 1 tablet by mouth daily at 12 noon. (Patient not taking: Reported on 09/08/2021)     No current facility-administered medications on file prior to visit.    Allergies  Allergen Reactions   Penicillins Swelling and Rash    Throat swells    Social History:  reports that she has never smoked. She has never used smokeless tobacco. She reports that she does not drink alcohol and does not use drugs.  Family History  Problem Relation Age of Onset   Diabetes Maternal Grandmother    Hypertension Maternal Grandmother    Stroke Maternal Grandmother    Heart attack Father    Hypertension Father    Hypertension Mother    Hypercholesterolemia Mother    Psoriasis Mother    Hypertension Maternal Grandfather    Hyperlipidemia Maternal Grandfather    Hyperlipidemia Paternal Grandfather    Hypertension Paternal Grandfather    Heart disease Paternal Grandfather    Stroke Paternal Grandfather    Breast cancer Cousin 30    The following portions of the patient's history were reviewed and updated as appropriate: allergies, current medications, past family history, past medical history, past social history, past surgical history and problem list.  Review of Systems Pertinent items noted in HPI and remainder of comprehensive ROS otherwise  negative.  Physical Exam:  BP (!) 175/109   Pulse (!) 102   Wt 155 lb (70.3 kg)   LMP 07/13/2022 (Approximate)   BMI 25.79 kg/m  CONSTITUTIONAL: Well-developed, well-nourished female in no acute distress.  HENT:  Normocephalic, atraumatic, External right and left ear normal.  EYES: Conjunctivae and EOM are normal. Pupils are equal, round, and reactive to light. No scleral icterus.  NECK: Normal range of motion, supple, no masses.  Normal thyroid.  SKIN: Skin is warm and dry. No rash noted. Not diaphoretic. No erythema. No pallor. MUSCULOSKELETAL: Normal range of motion. No tenderness.  No cyanosis, clubbing,  or edema. NEUROLOGIC: Alert and oriented to person, place, and time. Normal reflexes, muscle tone coordination.  PSYCHIATRIC: Normal mood and affect. Normal behavior. Normal judgment and thought content. CARDIOVASCULAR: Normal heart rate noted, regular rhythm RESPIRATORY: Clear to auscultation bilaterally. Effort and breath sounds normal, no problems with respiration noted. BREASTS: Symmetric in size. No masses, tenderness, skin changes, nipple drainage, or lymphadenopathy bilaterally. Performed in the presence of a chaperone. ABDOMEN: Soft, no distention noted.  No tenderness, rebound or guarding.  PELVIC: Normal appearing external genitalia and urethral meatus; normal appearing vaginal mucosa and cervix.  No abnormal vaginal discharge noted.  Pap smear obtained.  Performed in the presence of a chaperone.   Assessment and Plan:    1. Well woman exam with routine gynecological exam - Normal physical exam - Plan for first mammogram age 1 unless otherwise indicated by abnormal breast exam - Cytology - PAP  2. Benign essential hypertension - 183/112 in [facility]-ED on 07/26/2022 - Now on diuretic and Prozac for anxiety - PCP appointment in a few days  3. Encounter for counseling regarding contraception - Patient advised to d/c COCPs immediately - Reviewed CDC [facility] chart in the context of significantly elevated BPs - Counseled on POPs vs IUDs - Patient elects trial of POPs, will initiate Slynd for improved lifestyle accommodation  Will follow up results of pap smear and manage accordingly. Routine preventative health maintenance measures emphasized. Please refer to After Visit Summary for other counseling recommendations.      Clayton Bibles, MSA, MSN, CNM Certified Nurse Midwife, Biochemist, clinical for [employer], [facility]

## 2022-08-01 DIAGNOSIS — R739 Hyperglycemia, unspecified: Secondary | ICD-10-CM | POA: Diagnosis not present

## 2022-08-01 DIAGNOSIS — R000 Tachycardia, unspecified: Secondary | ICD-10-CM | POA: Diagnosis not present

## 2022-08-01 DIAGNOSIS — H9319 Tinnitus, unspecified ear: Secondary | ICD-10-CM | POA: Diagnosis not present

## 2022-08-01 DIAGNOSIS — I10 Essential (primary) hypertension: Secondary | ICD-10-CM | POA: Diagnosis not present

## 2022-08-01 DIAGNOSIS — F411 Generalized anxiety disorder: Secondary | ICD-10-CM | POA: Diagnosis not present

## 2022-08-01 LAB — CYTOLOGY - PAP
Comment: NEGATIVE
Diagnosis: NEGATIVE
High risk HPV: NEGATIVE

## 2022-08-07 ENCOUNTER — Other Ambulatory Visit: Payer: Self-pay | Admitting: Family Medicine

## 2022-08-07 DIAGNOSIS — Z30011 Encounter for initial prescription of contraceptive pills: Secondary | ICD-10-CM

## 2022-08-08 NOTE — ED Provider Notes (Signed)
[facility] Provider Note    Event Date/Time   First MD Initiated Contact with Patient 07/26/22 1457     (approximate)   History   Anxiety   HPI  Anne Phelps is a 37 y.o. female who presents with complaints of high blood pressure and anxiety.  Patient reports she has been suffering from significant anxiety and has difficulty relaxing.  She thinks this has been affecting her blood pressure.  Her spouse is here and agrees with this assessment.  No psychiatric complaints besides significant stress and worry     Physical Exam   Triage Vital Signs: ED Triage Vitals  Enc Vitals Group     BP 07/26/22 1354 (!) 183/112     Pulse Rate 07/26/22 1354 (!) 110     Resp 07/26/22 1354 17     Temp 07/26/22 1354 98 F (36.7 C)     Temp Source 07/26/22 1354 Oral     SpO2 07/26/22 1354 100 %     Weight 07/26/22 1354 68 kg (150 lb)     Height 07/26/22 1517 1.651 m (5\' 5" )     Head Circumference --      Peak Flow --      Pain Score 07/26/22 1354 0     Pain Loc --      Pain Edu? --      Excl. in GC? --     Most recent vital signs: Vitals:   07/26/22 1354  BP: (!) 183/112  Pulse: (!) 110  Resp: 17  Temp: 98 F (36.7 C)  SpO2: 100%     General: Awake, no distress.  CV:  Good peripheral perfusion.  Resp:  Normal effort.  Abd:  No distention.  Other:     ED Results / Procedures / Treatments   Labs (all labs ordered are listed, but only abnormal results are displayed) Labs Reviewed  BASIC METABOLIC PANEL - Abnormal; Notable for the following components:      Result Value   CO2 21 (*)    Glucose, Bld 162 (*)    All other components within normal limits  CBC  TROPONIN I (HIGH SENSITIVITY)     EKG  ED ECG REPORT I, Jene Every, the attending physician, personally viewed and interpreted this ECG.  Date: 08/08/2022  Rhythm: normal sinus rhythm QRS Axis: normal Intervals: normal ST/T Wave abnormalities: normal Narrative  Interpretation: no evidence of acute ischemia    RADIOLOGY     PROCEDURES:  Critical Care performed:   Procedures   MEDICATIONS ORDERED IN ED: Medications - No data to display   IMPRESSION / MDM / ASSESSMENT AND PLAN / ED COURSE  I reviewed the triage vital signs and the nursing notes. Patient's presentation is most consistent with severe exacerbation of chronic illness.  Patient with a history of gestational hypertension, presents today with elevated blood pressure, mild tachycardia and appears quite anxious.  Differential includes anxiety, hypertension, combination of both.  Her lab work is reassuring, no evidence of kidney injury or elevated cardiac enzymes.  After long discussion with her we will start the patient on chlorthalidone as well as a trial of Prozac with the recognition that I cannot adjust her doses and that she needs close follow-up with PCP.        FINAL CLINICAL IMPRESSION(S) / ED DIAGNOSES   Final diagnoses:  Anxiety  Hypertension, unspecified type     Rx / DC Orders   ED Discharge Orders  Ordered    chlorthalidone (HYGROTON) 25 MG tablet  Daily        07/26/22 1512    FLUoxetine (PROZAC) 10 MG capsule  Daily        07/26/22 1512    Ambulatory Referral to Primary Care (Establish Care)        07/26/22 1512             Note:  This document was prepared using Dragon voice recognition software and may include unintentional dictation errors.   Jene Every, MD 08/08/22 [id number]

## 2022-08-10 DIAGNOSIS — O24419 Gestational diabetes mellitus in pregnancy, unspecified control: Secondary | ICD-10-CM | POA: Diagnosis not present

## 2022-08-10 DIAGNOSIS — I10 Essential (primary) hypertension: Secondary | ICD-10-CM | POA: Diagnosis not present

## 2022-08-29 DIAGNOSIS — E785 Hyperlipidemia, unspecified: Secondary | ICD-10-CM | POA: Diagnosis not present

## 2022-09-14 DIAGNOSIS — E876 Hypokalemia: Secondary | ICD-10-CM | POA: Diagnosis not present

## 2022-09-14 DIAGNOSIS — I10 Essential (primary) hypertension: Secondary | ICD-10-CM | POA: Diagnosis not present

## 2022-10-25 DIAGNOSIS — I10 Essential (primary) hypertension: Secondary | ICD-10-CM | POA: Diagnosis not present

## 2023-08-22 ENCOUNTER — Ambulatory Visit: Admitting: Obstetrics and Gynecology

## 2023-08-22 ENCOUNTER — Encounter: Payer: Self-pay | Admitting: Obstetrics and Gynecology

## 2023-08-22 VITALS — BP 144/93 | HR 90 | Wt 171.0 lb

## 2023-08-22 DIAGNOSIS — Z01419 Encounter for gynecological examination (general) (routine) without abnormal findings: Secondary | ICD-10-CM

## 2023-08-22 DIAGNOSIS — Z1331 Encounter for screening for depression: Secondary | ICD-10-CM | POA: Diagnosis not present

## 2023-08-22 MED ORDER — SLYND 4 MG PO TABS: 1.0000 | ORAL_TABLET | Freq: Every day | ORAL | 3 refills | Status: AC

## 2023-08-22 NOTE — Patient Instructions (Addendum)
 [email or web address]  [facility] You

## 2023-08-22 NOTE — Progress Notes (Signed)
 ANNUAL EXAM Patient name: Anne Phelps MRN [id number]  Date of birth: Oct 29, 1985 Chief Complaint:   Gynecologic Exam  History of Present Illness:   Anne Phelps is a 38 y.o. [phone] with Patient's last menstrual period was 08/14/2023. being seen today for a routine annual exam.  Current complaints: Happy with Slynd , needs refill   Upstream - 08/22/23 1041       Pregnancy Intention Screening   Does the patient want to become pregnant in the next year? No    Does the patient's partner want to become pregnant in the next year? No    Would the patient like to discuss contraceptive options today? Yes      Contraception Wrap Up   Current Method Oral Contraceptive    End Method Oral Contraceptive    Contraception Counseling Provided No    How was the end contraceptive method provided? Prescription         The pregnancy intention screening data noted above was reviewed. Potential methods of contraception were discussed. The patient elected to proceed with Oral Contraceptive.   Last pap 07/28/22. Results were: NILM w/ HRHPV negative. H/O abnormal pap: no Last mammogram: n/a. Family h/o breast cancer: paternal cousin Last colonoscopy: n/a. Family h/o colorectal cancer: no HPV vaccine: has not received     08/22/2023   10:24 AM 07/28/2022    1:25 PM 07/08/2021    8:54 AM 05/19/2021   11:34 AM 12/29/2020    9:45 AM  Depression screen PHQ 2/9  Decreased Interest 0 1 0 0 0  Down, Depressed, Hopeless 0 1 0 0 0  PHQ - 2 Score 0 2 0 0 0  Altered sleeping 0 1 0  0  Tired, decreased energy 1 2 1  1   Change in appetite 0 0 0  0  Feeling bad or failure about yourself  0 0 0  0  Trouble concentrating 0 1 0  0  Moving slowly or fidgety/restless 0 0 0  0  Suicidal thoughts 0 0 0  0  PHQ-9 Score 1 6 1  1   Difficult doing work/chores Not difficult at all Somewhat difficult Not difficult at all  Not difficult at all        08/22/2023   10:24 AM 07/28/2022    1:26 PM 07/08/2021    8:55 AM  12/29/2020    9:45 AM  GAD 7 : Generalized Anxiety Score  Nervous, Anxious, on Edge 0 3 1 1   Control/stop worrying 0 3 0 0  Worry too much - different things 0 3 0 0  Trouble relaxing 1 2 0 0  Restless 0 0 0 0  Easily annoyed or irritable 0 2 0 0  Afraid - awful might happen 0 1 0 0  Total GAD 7 Score 1 14 1 1   Anxiety Difficulty Not difficult at all  Not difficult at all Not difficult at all     Review of Systems:   Pertinent items are noted in HPI Denies any headaches, blurred vision, fatigue, shortness of breath, chest pain, abdominal pain, abnormal vaginal discharge/itching/odor/irritation, problems with periods, bowel movements, urination, or intercourse unless otherwise stated above. Pertinent History Reviewed:  Reviewed past medical,surgical, social and family history.  Reviewed problem list, medications and allergies. Physical Assessment:   Vitals:   08/22/23 1014  BP: (!) 144/93  Pulse: 90  Weight: 171 lb (77.6 kg)  Body mass index is 28.46 kg/m.        Physical  Examination:   General appearance - well appearing, and in no distress  Mental status - alert, oriented to person, place, and time  Chest - respiratory effort normal  Heart - normal peripheral perfusion  Breasts - Offered, deferred  Abdomen - soft, nontender, nondistended, no masses or organomegaly  Pelvic - Offered, deferred  Chaperone present for exam  No results found for this or any previous visit (from the past 24 hours).  Assessment & Plan:  1) Well-Woman Exam Mammogram: @ 40yo, or sooner if problems Colonoscopy: @ 39yo, or sooner if problems Pap: Due 2029 Gardasil: Discussed, declines GC/CT: UTD HIV/HCV: UTD Has 9yo daughter, discussed puberty resources Following with PCP for HTN  Meds:  Meds ordered this encounter  Medications   Drospirenone  (SLYND ) 4 MG TABS    Sig: Take 1 tablet (4 mg total) by mouth daily.    Dispense:  90 tablet    Refill:  3   Follow-up: Return in about 1  year (around 08/21/2024) for annual exam or sooner as needed.  Kieth JAYSON Carolin, MD 08/22/2023 10:41 AM

## 2023-08-22 NOTE — Progress Notes (Signed)
 Patient presents for Annual. Pt recently dx w/ POTS ([facility]. Dr.Blevins) LMP: 08/14/23 Last pap: 07/28/22  Contraception: OCP Slynd  Needs RF Mammogram: Not yet indicated Family Hx of Breast Cancer Paternal Cousin STD Screening: Declines   CC: Annual/None

## 2024-03-15 IMAGING — US US MFM OB FOLLOW-UP
1 series · 13 of 28 positions shown · non-contrast
Comparison: none

[Series 1: us mfm ob follow-up · 45 acquisitions, 13 frames shown]
[im 2/45]
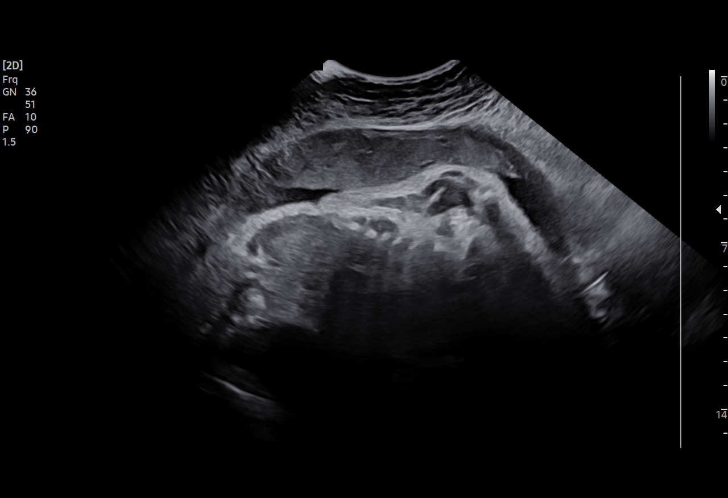
[im 5/45]
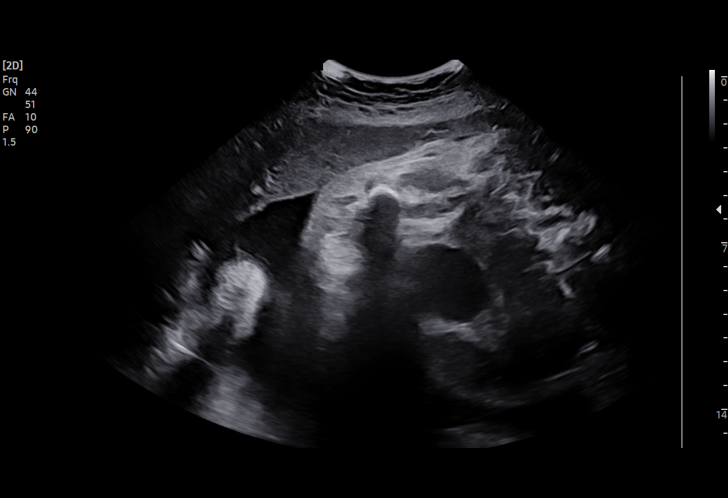
[im 9/45]
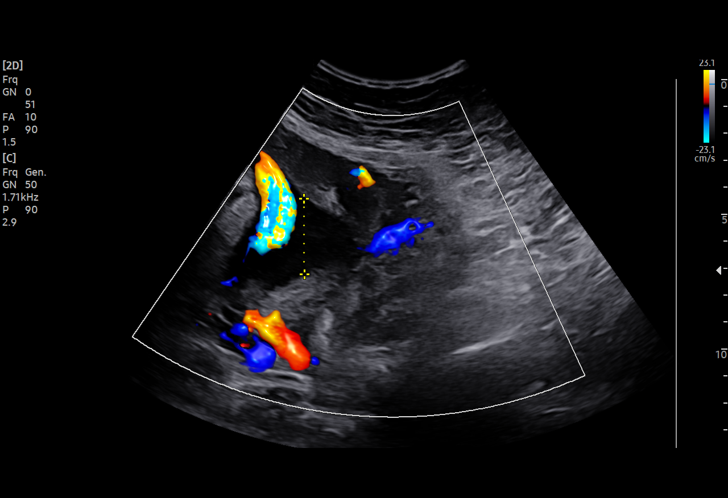
[im 12/45]
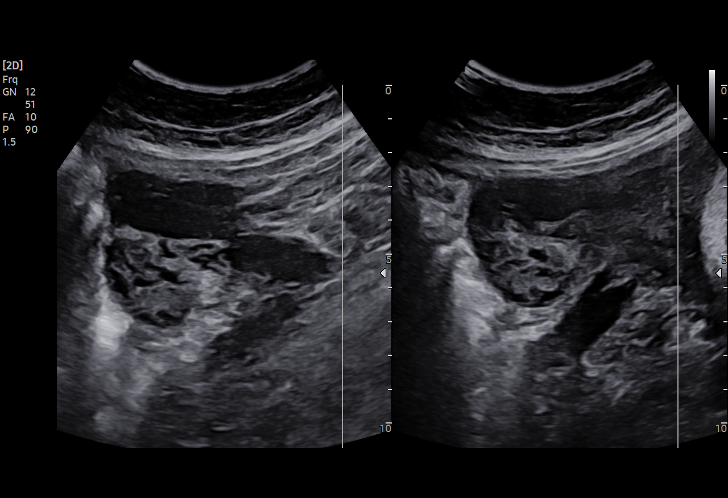
[im 15/45]
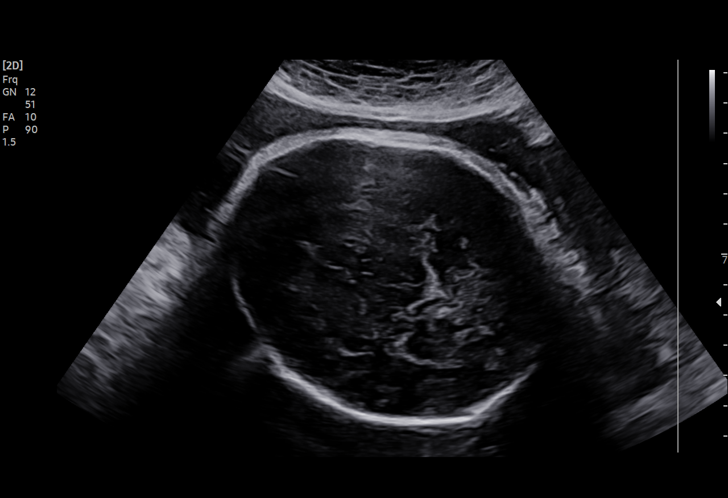
[im 18/45]
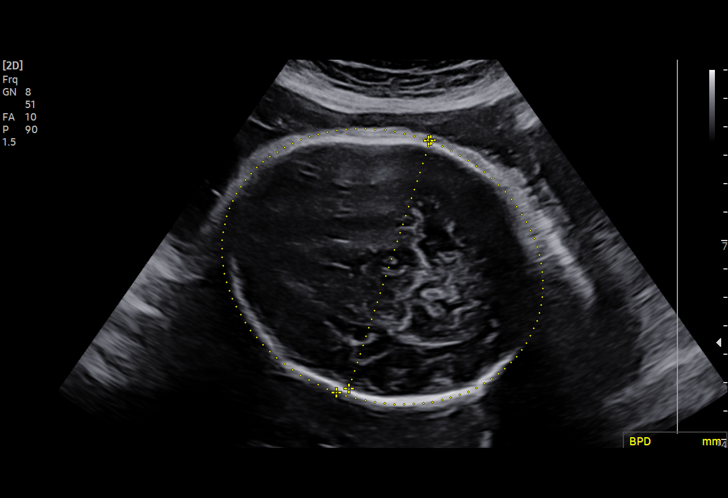
[im 23/45]
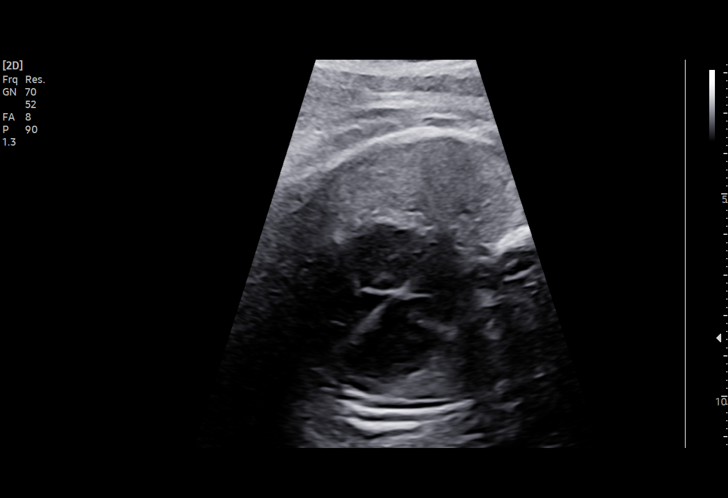
[im 27/45]
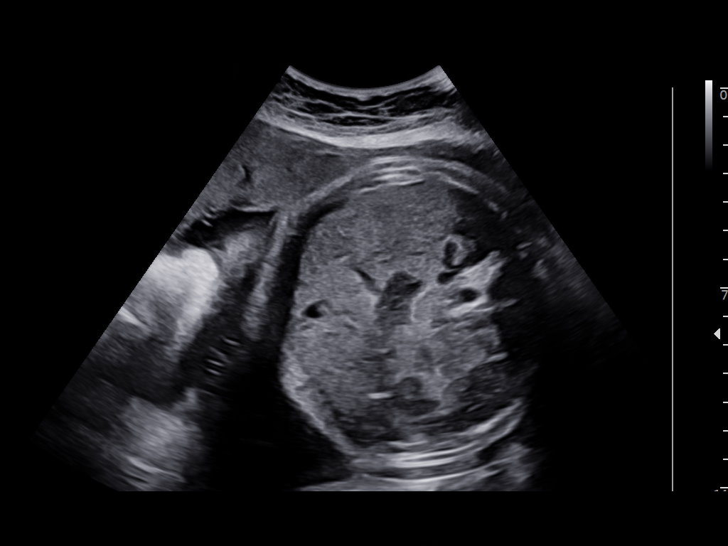
[im 30/45]
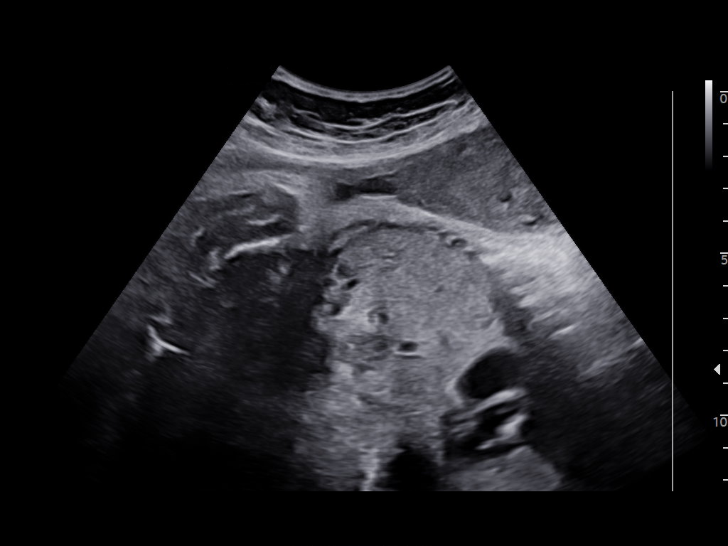
[im 33/45]
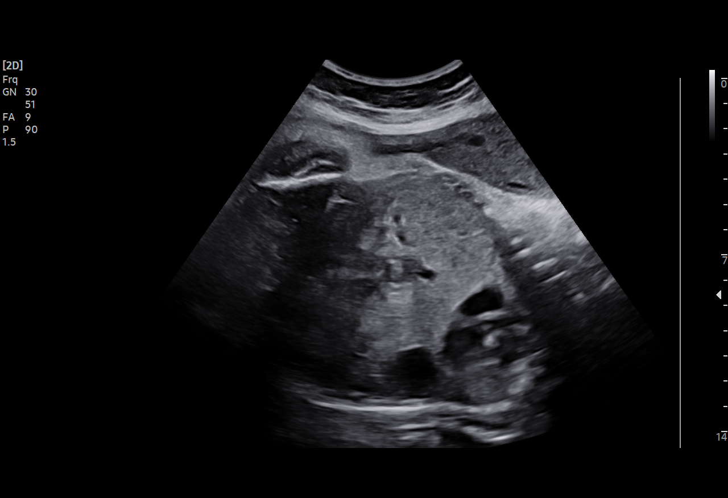
[im 36/45]
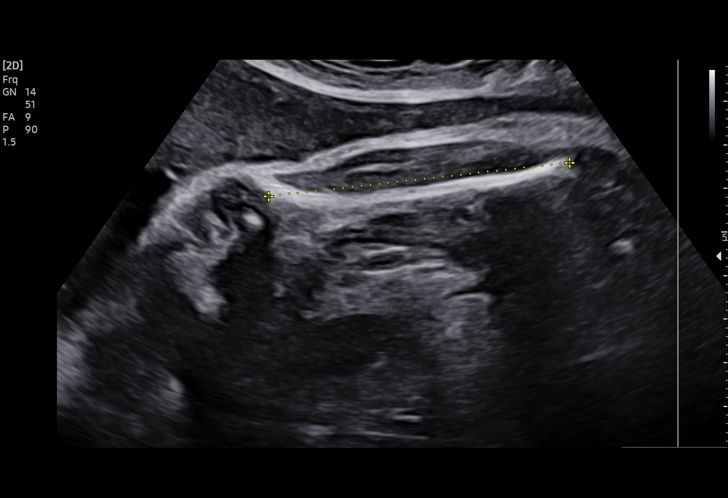
[im 40/45]
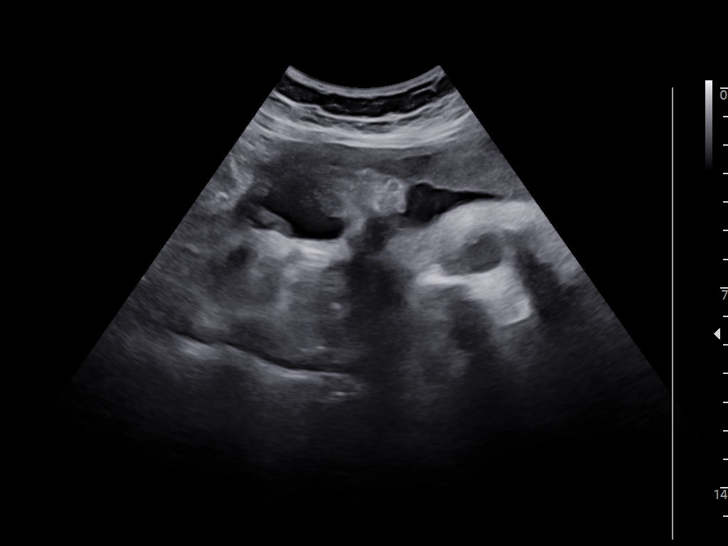
[im 43/45]
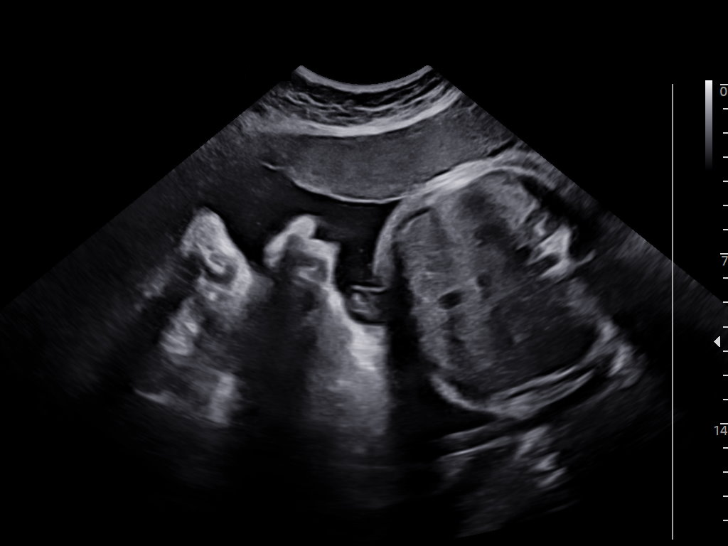

[13 of 28 positions shown; findings below may reference images not displayed]

Indications

 37 weeks gestation of pregnancy
 Encounter for other antenatal screening
 follow-up
 Hypertension - Chronic/Pre-existing (No
 meds)
 Advanced maternal age multigravida 35+,
 third trimester (35 yrs)
 Gestational diabetes in pregnancy, diet
 controlled
 LOW risk NIPS  /  Negative AFP
Fetal Evaluation

 Num Of Fetuses:         1
 Fetal Heart Rate(bpm):  125
 Cardiac Activity:       Observed
 Presentation:           Cephalic
 Placenta:               Anterior
 P. Cord Insertion:      Previously Visualized

 Amniotic Fluid
 AFI FV:      Within normal limits

 AFI Sum(cm)     %Tile       Largest Pocket(cm)
 15.78           61

 RUQ(cm)       RLQ(cm)       LUQ(cm)        LLQ(cm)

Biophysical Evaluation

 Amniotic F.V:   Pocket => 2 cm             F. Tone:        Observed
 F. Movement:    Observed                   Score:          [DATE]
 F. Breathing:   Observed
Biometry

 BPD:     91.21  mm     G. Age:  37w 0d         49  %    CI:        77.03   %    70 - 86
                                                         FL/HC:      21.0   %    20.9 -
 HC:      329.1  mm     G. Age:  37w 3d         20  %    HC/AC:      0.97        0.92 -
 AC:    339.19   mm     G. Age:  37w 6d         67  %    FL/BPD:     75.9   %    71 - 87
 FL:      69.24  mm     G. Age:  35w 4d          6  %    FL/AC:      20.4   %    20 - 24
 LV:          8  mm

 Est. FW:    4842  gm    6 lb 14 oz      42  %
OB History

 Gravidity:    2         Term:   1        Prem:   0        SAB:   0
 TOP:          0       Ectopic:  0        Living: 1
Gestational Age

 LMP:           37w 6d        Date:  10/28/20                  EDD:   08/04/21
 U/S Today:     37w 0d                                        EDD:   08/10/21
 Best:          37w 6d     Det. By:  LMP  (10/28/20)          EDD:   08/04/21
Anatomy

 Cranium:               Appears normal         LVOT:                   Previously seen
 Cavum:                 Appears normal         Aortic Arch:            Previously seen
 Ventricles:            Appears normal         Ductal Arch:            Previously seen
 Choroid Plexus:        Previously seen        Diaphragm:              Appears normal
 Cerebellum:            Previously seen        Stomach:                Appears normal, left
                                                                       sided
 Posterior Fossa:       Previously seen        Abdomen:                Previously seen
 Nuchal Fold:           Not applicable (>20    Abdominal Wall:         Previously seen
                        wks GA)
 Face:                  Orbits and profile     Cord Vessels:           Previously seen
                        previously seen
 Lips:                  Previously seen        Kidneys:                Appear normal
 Palate:                Previously seen        Bladder:                Appears normal
 Thoracic:              Appears normal         Spine:                  Previously seen
 Heart:                 Previously seen        Upper Extremities:      Previously seen
 RVOT:                  Previously seen        Lower Extremities:      Previously seen

 Other:  Female gender previously seen. Heels and 5th digit, nasal bone, VC,
         3VV and 3VTV previously visualized. Technically difficult due to
         advanced GA and fetal position.
Cervix Uterus Adnexa

 Cervix
 Not visualized (advanced GA >90wks)
 Uterus
 No abnormality visualized.

 Right Ovary
 Within normal limits.

 Left Ovary
 Within normal limits.

 Cul De Sac
 No free fluid seen.

 Adnexa
 No abnormality visualized.
Impression

 Follow up growth chronic hypertension, advanced maternal
 age and 5FQAI.
 Normal interval growth with measurements consistent with
 dates
 Good fetal movement and amniotic fluid volume
 Biophysical profile [DATE]
Recommendations

 Follow up growth as clinically indicated.
